# Patient Record
Sex: Female | Born: 1995 | Race: Black or African American | Hispanic: No | Marital: Single | Smoking: Never smoker
Health system: Southern US, Community
[De-identification: ages and names within clinical notes are randomized; demographics above are authoritative.]

## PROBLEM LIST (undated history)

## (undated) ENCOUNTER — Inpatient Hospital Stay (HOSPITAL_COMMUNITY): Payer: Self-pay

## (undated) HISTORY — PX: OTHER SURGICAL HISTORY: SHX169

---
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
## 2016-03-21 LAB — OB RESULTS CONSOLE ANTIBODY SCREEN: Antibody Screen: NEGATIVE

## 2016-03-21 LAB — OB RESULTS CONSOLE GC/CHLAMYDIA
CHLAMYDIA, DNA PROBE: NEGATIVE
Gonorrhea: NEGATIVE

## 2016-03-21 LAB — OB RESULTS CONSOLE HIV ANTIBODY (ROUTINE TESTING): HIV: NONREACTIVE

## 2016-03-21 LAB — OB RESULTS CONSOLE HEPATITIS B SURFACE ANTIGEN: HEP B S AG: NEGATIVE

## 2016-03-21 LAB — OB RESULTS CONSOLE ABO/RH: RH Type: POSITIVE

## 2016-03-21 LAB — OB RESULTS CONSOLE RPR: RPR: NONREACTIVE

## 2016-03-21 LAB — OB RESULTS CONSOLE RUBELLA ANTIBODY, IGM: RUBELLA: IMMUNE

## 2016-08-02 ENCOUNTER — Inpatient Hospital Stay (HOSPITAL_COMMUNITY)
Admission: AD | Admit: 2016-08-02 | Discharge: 2016-08-02 | Disposition: A | Discharge status: Home / Self Care | Payer: Federal, State, Local not specified - PPO | Source: Ambulatory Visit | Attending: Obstetrics and Gynecology | Admitting: Obstetrics and Gynecology

## 2016-08-02 ENCOUNTER — Encounter (HOSPITAL_COMMUNITY): Payer: Self-pay | Admitting: *Deleted

## 2016-08-02 DIAGNOSIS — K219 Gastro-esophageal reflux disease without esophagitis: Secondary | ICD-10-CM | POA: Insufficient documentation

## 2016-08-02 DIAGNOSIS — O99612 Diseases of the digestive system complicating pregnancy, second trimester: Secondary | ICD-10-CM | POA: Diagnosis not present

## 2016-08-02 DIAGNOSIS — Z36 Encounter for antenatal screening of mother: Secondary | ICD-10-CM | POA: Diagnosis not present

## 2016-08-02 DIAGNOSIS — R1010 Upper abdominal pain, unspecified: Secondary | ICD-10-CM | POA: Diagnosis present

## 2016-08-02 DIAGNOSIS — Z3689 Encounter for other specified antenatal screening: Secondary | ICD-10-CM

## 2016-08-02 DIAGNOSIS — Z331 Pregnant state, incidental: Secondary | ICD-10-CM

## 2016-08-02 DIAGNOSIS — Z3A27 27 weeks gestation of pregnancy: Secondary | ICD-10-CM | POA: Insufficient documentation

## 2016-08-02 DIAGNOSIS — Z3493 Encounter for supervision of normal pregnancy, unspecified, third trimester: Secondary | ICD-10-CM

## 2016-08-02 LAB — AMYLASE: Amylase: 73 U/L (ref 28–100)

## 2016-08-02 LAB — URINALYSIS, ROUTINE W REFLEX MICROSCOPIC
BILIRUBIN URINE: NEGATIVE
GLUCOSE, UA: NEGATIVE mg/dL
HGB URINE DIPSTICK: NEGATIVE
Ketones, ur: NEGATIVE mg/dL
Nitrite: NEGATIVE
PH: 7 (ref 5.0–8.0)
Protein, ur: NEGATIVE mg/dL
SPECIFIC GRAVITY, URINE: 1.02 (ref 1.005–1.030)

## 2016-08-02 LAB — CBC
HEMATOCRIT: 26.8 % — AB (ref 36.0–46.0)
HEMOGLOBIN: 9 g/dL — AB (ref 12.0–15.0)
MCH: 27 pg (ref 26.0–34.0)
MCHC: 33.6 g/dL (ref 30.0–36.0)
MCV: 80.5 fL (ref 78.0–100.0)
Platelets: 171 10*3/uL (ref 150–400)
RBC: 3.33 MIL/uL — AB (ref 3.87–5.11)
RDW: 14.4 % (ref 11.5–15.5)
WBC: 7.6 10*3/uL (ref 4.0–10.5)

## 2016-08-02 LAB — COMPREHENSIVE METABOLIC PANEL
ALBUMIN: 2.8 g/dL — AB (ref 3.5–5.0)
ALT: 15 U/L (ref 14–54)
AST: 15 U/L (ref 15–41)
Alkaline Phosphatase: 70 U/L (ref 38–126)
Anion gap: 7 (ref 5–15)
BUN: 9 mg/dL (ref 6–20)
CO2: 23 mmol/L (ref 22–32)
CREATININE: 0.49 mg/dL (ref 0.44–1.00)
Calcium: 8.6 mg/dL — ABNORMAL LOW (ref 8.9–10.3)
Chloride: 106 mmol/L (ref 101–111)
GFR calc Af Amer: >60 mL/min (ref >60)
GFR calc non Af Amer: >60 mL/min (ref >60)
Glucose, Bld: 96 mg/dL (ref 65–99)
POTASSIUM: 3.4 mmol/L — AB (ref 3.5–5.1)
Sodium: 136 mmol/L (ref 135–145)
Total Bilirubin: 0.3 mg/dL (ref 0.3–1.2)
Total Protein: 6.2 g/dL — ABNORMAL LOW (ref 6.5–8.1)

## 2016-08-02 LAB — URINE MICROSCOPIC-ADD ON: RBC / HPF: NONE SEEN RBC/hpf (ref 0–5)

## 2016-08-02 LAB — LIPASE, BLOOD: Lipase: 21 U/L (ref 11–51)

## 2016-08-02 MED ORDER — GI COCKTAIL ~~LOC~~
30.0000 mL | Freq: Once | ORAL | Status: AC
  Administered 2016-08-02: 30 mL via ORAL
  Filled 2016-08-02: qty 30

## 2016-08-02 MED ORDER — RANITIDINE HCL 150 MG PO TABS: 150.0000 mg | ORAL_TABLET | Freq: Every day | ORAL | 1 refills | Status: DC

## 2016-08-02 NOTE — MAU Provider Note (Signed)
  History     CSN: [id number]  Arrival date and time: 08/02/16 1731   None     Chief Complaint  Patient presents with  . Abdominal Pain   G2P0010 @27 .6 weeks c/o intermittent upper abdominal pain since this am. She describes it feeling like a "charley horse" but sharp at times. She thinks it may be worse after meals. She reports having bacon with her breakfast. She denies N/V/C/D. No fevers. She reports good FM. No VB, LOF, or ctx. She reports hx of heartburn at times. She reports the pain is decreased from earlier today.    OB History    Gravida Para Term Preterm AB Living   2       1     SAB TAB Ectopic Multiple Live Births   1              History reviewed. No pertinent past medical history.  Past Surgical History:  Procedure Laterality Date  . dilate and evacutation      History reviewed. No pertinent family history.  Social History  Substance Use Topics  . Smoking status: Never Smoker  . Smokeless tobacco: Not on file  . Alcohol use No    Allergies: No Known Allergies  Prescriptions Prior to Admission  Medication Sig Dispense Refill Last Dose  . ibuprofen (ADVIL,MOTRIN) 800 MG tablet Take 1 tablet (800 mg total) by mouth every 8 (eight) hours as needed. 15 tablet 0   . naproxen sodium (ANAPROX) 220 MG tablet Take 220 mg by mouth 2 (two) times daily with a meal.   11/02/2013 at Unknown time    Review of Systems  Constitutional: Negative.   Gastrointestinal: Positive for abdominal pain.  Genitourinary: Negative.    Physical Exam   Blood pressure 114/61, pulse 88, temperature 98 F (36.7 C), temperature source Oral, resp. rate 16.  Physical Exam  Constitutional: She is oriented to person, place, and time. She appears well-developed and well-nourished.  HENT:  Head: Normocephalic and atraumatic.  Neck: Normal range of motion. Neck supple.  Cardiovascular: Normal rate.   Respiratory: Effort normal.  GI: Soft. She exhibits no distension and no mass.  There is no tenderness. There is no rebound and no guarding.  gravid  Genitourinary:  Genitourinary Comments: SVE: closed/thick  Musculoskeletal: Normal range of motion.  Neurological: She is alert and oriented to person, place, and time.  Skin: Skin is warm and dry.  Psychiatric: She has a normal mood and affect.   EFM: 150 bpm, mod variability, + accels, no decels Toco: none  MAU Course  Procedures GI cocktail  MDM Labs ordered and reviewed. Presentation, clinical findings, and plan discussed with Dr. Rana SnareLowe. Pain improved after GI cocktail. No evidence of acute GI process or PTL. Stable for discharge home.  Assessment and Plan   1. Gastroesophageal reflux disease without esophagitis   2. Third trimester pregnancy   3. NST (non-stress test) reactive    Discharge home GERD diet Follow up in office as scheduled    Medication List    TAKE these medications   ferrous sulfate 325 (65 FE) MG tablet Take 325 mg by mouth daily with breakfast.   prenatal multivitamin Tabs tablet Take 1 tablet by mouth daily at 12 noon.   ranitidine 150 MG tablet Commonly known as:  ZANTAC Take 1 tablet (150 mg total) by mouth at bedtime.       Donette LarryMelanie Detron Carras, CNM 08/02/2016, 6:50 PM

## 2016-08-02 NOTE — Discharge Instructions (Signed)
Food Choices for Gastroesophageal Reflux Disease, Adult °When you have gastroesophageal reflux disease (GERD), the foods you eat and your eating habits are very important. Choosing the right foods can help ease the discomfort of GERD. °WHAT GENERAL GUIDELINES DO I NEED TO FOLLOW? °· Choose fruits, vegetables, whole grains, low-fat dairy products, and low-fat meat, fish, and poultry. °· Limit fats such as oils, salad dressings, butter, nuts, and avocado. °· Keep a food diary to identify foods that cause symptoms. °· Avoid foods that cause reflux. These may be different for different people. °· Eat frequent small meals instead of three large meals each day. °· Eat your meals slowly, in a relaxed setting. °· Limit fried foods. °· Cook foods using methods other than frying. °· Avoid drinking alcohol. °· Avoid drinking large amounts of liquids with your meals. °· Avoid bending over or lying down until 2-3 hours after eating. °WHAT FOODS ARE NOT RECOMMENDED? °The following are some foods and drinks that may worsen your symptoms: °Vegetables °Tomatoes. Tomato juice. Tomato and spaghetti sauce. Chili peppers. Onion and garlic. Horseradish. °Fruits °Oranges, grapefruit, and lemon (fruit and juice). °Meats °High-fat meats, fish, and poultry. This includes hot dogs, ribs, ham, sausage, salami, and bacon. °Dairy °Whole milk and chocolate milk. Sour cream. Cream. Butter. Ice cream. Cream cheese.  °Beverages °Coffee and tea, with or without caffeine. Carbonated beverages or energy drinks. °Condiments °Hot sauce. Barbecue sauce.  °Sweets/Desserts °Chocolate and cocoa. Donuts. Peppermint and spearmint. °Fats and Oils °High-fat foods, including French fries and potato chips. °Other °Vinegar. Strong spices, such as black pepper, white pepper, red pepper, cayenne, curry powder, cloves, ginger, and chili powder. °The items listed above may not be a complete list of foods and beverages to avoid. Contact your dietitian for more  information. °  °This information is not intended to replace advice given to you by your health care provider. Make sure you discuss any questions you have with your health care provider. °  °Document Released: 10/22/2005 Document Revised: 11/12/2014 Document Reviewed: 08/26/2013 °Elsevier Interactive Patient Education ©2016 [employer] ° °Gastroesophageal Reflux Disease, Adult °Normally, food travels down the esophagus and stays in the stomach to be digested. However, when a person has gastroesophageal reflux disease (GERD), food and stomach acid move back up into the esophagus. When this happens, the esophagus becomes sore and inflamed. Over time, GERD can create small holes (ulcers) in the lining of the esophagus.  °CAUSES °This condition is caused by a problem with the muscle between the esophagus and the stomach (lower esophageal sphincter, or LES). Normally, the LES muscle closes after food passes through the esophagus to the stomach. When the LES is weakened or abnormal, it does not close properly, and that allows food and stomach acid to go back up into the esophagus. The LES can be weakened by certain dietary substances, medicines, and medical conditions, including: °· Tobacco use. °· Pregnancy. °· Having a hiatal hernia. °· Heavy alcohol use. °· Certain foods and beverages, such as coffee, chocolate, onions, and peppermint. °RISK FACTORS °This condition is more likely to develop in: °· People who have an increased body weight. °· People who have connective tissue disorders. °· People who use NSAID medicines. °SYMPTOMS °Symptoms of this condition include: °· Heartburn. °· Difficult or painful swallowing. °· The feeling of having a lump in the throat. °· A bitter taste in the mouth. °· Bad breath. °· Having a large amount of saliva. °· Having an upset or bloated stomach. °· Belching. °· Chest pain. °·   Shortness of breath or wheezing. °· Ongoing (chronic) cough or a night-time cough. °· Wearing away of  tooth enamel. °· Weight loss. °Different conditions can cause chest pain. Make sure to see your health care provider if you experience chest pain. °DIAGNOSIS °Your health care provider will take a medical history and perform a physical exam. To determine if you have mild or severe GERD, your health care provider may also monitor how you respond to treatment. You may also have other tests, including: °· An endoscopy to examine your stomach and esophagus with a small camera. °· A test that measures the acidity level in your esophagus. °· A test that measures how much pressure is on your esophagus. °· A barium swallow or modified barium swallow to show the shape, size, and functioning of your esophagus. °TREATMENT °The goal of treatment is to help relieve your symptoms and to prevent complications. Treatment for this condition may vary depending on how severe your symptoms are. Your health care provider may recommend: °· Changes to your diet. °· Medicine. °· Surgery. °HOME CARE INSTRUCTIONS °Diet °· Follow a diet as recommended by your health care provider. This may involve avoiding foods and drinks such as: °¨ Coffee and tea (with or without caffeine). °¨ Drinks that contain alcohol. °¨ Energy drinks and sports drinks. °¨ Carbonated drinks or sodas. °¨ Chocolate and cocoa. °¨ Peppermint and mint flavorings. °¨ Garlic and onions. °¨ Horseradish. °¨ Spicy and acidic foods, including peppers, chili powder, curry powder, vinegar, hot sauces, and barbecue sauce. °¨ Citrus fruit juices and citrus fruits, such as oranges, lemons, and limes. °¨ Tomato-based foods, such as red sauce, chili, salsa, and pizza with red sauce. °¨ Fried and fatty foods, such as donuts, french fries, potato chips, and high-fat dressings. °¨ High-fat meats, such as hot dogs and fatty cuts of red and white meats, such as rib eye steak, sausage, ham, and bacon. °¨ High-fat dairy items, such as whole milk, butter, and cream cheese. °· Eat small,  frequent meals instead of large meals. °· Avoid drinking large amounts of liquid with your meals. °· Avoid eating meals during the 2-3 hours before bedtime. °· Avoid lying down right after you eat. °· Do not exercise right after you eat. ° General Instructions  °· Pay attention to any changes in your symptoms. °· Take over-the-counter and prescription medicines only as told by your health care provider. Do not take aspirin, ibuprofen, or other NSAIDs unless your health care provider told you to do so. °· Do not use any tobacco products, including cigarettes, chewing tobacco, and e-cigarettes. If you need help quitting, ask your health care provider. °· Wear loose-fitting clothing. Do not wear anything tight around your waist that causes pressure on your abdomen. °· Raise (elevate) the head of your bed 6 inches (15cm). °· Try to reduce your stress, such as with yoga or meditation. If you need help reducing stress, ask your health care provider. °· If you are overweight, reduce your weight to an amount that is healthy for you. Ask your health care provider for guidance about a safe weight loss goal. °· Keep all follow-up visits as told by your health care provider. This is important. °SEEK MEDICAL CARE IF: °· You have new symptoms. °· You have unexplained weight loss. °· You have difficulty swallowing, or it hurts to swallow. °· You have wheezing or a persistent cough. °· Your symptoms do not improve with treatment. °· You have a hoarse voice. °SEEK IMMEDIATE MEDICAL CARE IF: °· You have pain   in your arms, neck, jaw, teeth, or back. °· You feel sweaty, dizzy, or light-headed. °· You have chest pain or shortness of breath. °· You vomit and your vomit looks like blood or coffee grounds. °· You faint. °· Your stool is bloody or black. °· You cannot swallow, drink, or eat. °  °This information is not intended to replace advice given to you by your health care provider. Make sure you discuss any questions you have with  your health care provider. °  °Document Released: 08/01/2005 Document Revised: 07/13/2015 Document Reviewed: 02/16/2015 °Elsevier Interactive Patient Education ©2016 Elsevier Inc. ° °

## 2016-08-02 NOTE — MAU Note (Signed)
Pt C/O sharp pain across mid abdomen that started today, is worse with standing or walking.  Denies bleeding or LOF.  Denies dysuria, vomiting or diarrhea.

## 2016-10-01 LAB — OB RESULTS CONSOLE GBS: GBS: POSITIVE

## 2016-10-08 ENCOUNTER — Other Ambulatory Visit (HOSPITAL_COMMUNITY): Payer: Self-pay | Admitting: Obstetrics and Gynecology

## 2016-10-08 DIAGNOSIS — R52 Pain, unspecified: Secondary | ICD-10-CM

## 2016-10-09 ENCOUNTER — Ambulatory Visit (HOSPITAL_COMMUNITY)
Admission: RE | Admit: 2016-10-09 | Discharge: 2016-10-09 | Disposition: A | Discharge status: Home / Self Care | Payer: Federal, State, Local not specified - PPO | Source: Ambulatory Visit | Attending: Obstetrics and Gynecology | Admitting: Obstetrics and Gynecology

## 2016-10-09 DIAGNOSIS — M7989 Other specified soft tissue disorders: Secondary | ICD-10-CM | POA: Insufficient documentation

## 2016-10-09 DIAGNOSIS — R52 Pain, unspecified: Secondary | ICD-10-CM | POA: Insufficient documentation

## 2016-10-09 NOTE — Progress Notes (Signed)
VASCULAR LAB PRELIMINARY  PRELIMINARY  PRELIMINARY  PRELIMINARY  Right lower extremity venous duplex completed.    Preliminary report:  Right:  No evidence of DVT, superficial thrombosis, or Baker's cyst.  Adyn Hoes, RVS 10/09/2016, 3:46 PM

## 2016-10-18 ENCOUNTER — Inpatient Hospital Stay (HOSPITAL_COMMUNITY): Payer: 59 | Admitting: Anesthesiology

## 2016-10-18 ENCOUNTER — Inpatient Hospital Stay (HOSPITAL_COMMUNITY)
Admission: AD | Admit: 2016-10-18 | Discharge: 2016-10-20 | DRG: 775 | Disposition: A | Discharge status: Home / Self Care | Payer: 59 | Source: Ambulatory Visit | Attending: Obstetrics and Gynecology | Admitting: Obstetrics and Gynecology

## 2016-10-18 ENCOUNTER — Encounter (HOSPITAL_COMMUNITY): Payer: Self-pay

## 2016-10-18 DIAGNOSIS — O4292 Full-term premature rupture of membranes, unspecified as to length of time between rupture and onset of labor: Secondary | ICD-10-CM | POA: Diagnosis present

## 2016-10-18 DIAGNOSIS — Z3A38 38 weeks gestation of pregnancy: Secondary | ICD-10-CM

## 2016-10-18 LAB — CBC
HCT: 33 % — ABNORMAL LOW (ref 36.0–46.0)
Hemoglobin: 10.6 g/dL — ABNORMAL LOW (ref 12.0–15.0)
MCH: 26.6 pg (ref 26.0–34.0)
MCHC: 32.1 g/dL (ref 30.0–36.0)
MCV: 82.9 fL (ref 78.0–100.0)
PLATELETS: 126 10*3/uL — AB (ref 150–400)
RBC: 3.98 MIL/uL (ref 3.87–5.11)
RDW: 15.6 % — ABNORMAL HIGH (ref 11.5–15.5)
WBC: 7.9 10*3/uL (ref 4.0–10.5)

## 2016-10-18 LAB — TYPE AND SCREEN
ABO/RH(D): O POS
Antibody Screen: NEGATIVE

## 2016-10-18 LAB — RPR: RPR Ser Ql: NONREACTIVE

## 2016-10-18 LAB — POCT FERN TEST: POCT FERN TEST: POSITIVE

## 2016-10-18 LAB — ABO/RH: ABO/RH(D): O POS

## 2016-10-18 MED ORDER — BENZOCAINE-MENTHOL 20-0.5 % EX AERO
1.0000 "application " | INHALATION_SPRAY | CUTANEOUS | Status: DC | PRN
  Filled 2016-10-18 (×2): qty 56

## 2016-10-18 MED ORDER — ACETAMINOPHEN 325 MG PO TABS: 650.0000 mg | ORAL_TABLET | ORAL | Status: DC | PRN

## 2016-10-18 MED ORDER — DEXTROSE 5 % IV SOLN
5.0000 10*6.[IU] | Freq: Once | INTRAVENOUS | Status: AC
  Administered 2016-10-18: 5 10*6.[IU] via INTRAVENOUS
  Filled 2016-10-18: qty 5

## 2016-10-18 MED ORDER — DIPHENHYDRAMINE HCL 25 MG PO CAPS: 25.0000 mg | ORAL_CAPSULE | Freq: Four times a day (QID) | ORAL | Status: DC | PRN

## 2016-10-18 MED ORDER — DIPHENHYDRAMINE HCL 50 MG/ML IJ SOLN: 12.5000 mg | INTRAMUSCULAR | Status: DC | PRN

## 2016-10-18 MED ORDER — FENTANYL 2.5 MCG/ML BUPIVACAINE 1/10 % EPIDURAL INFUSION (WH - ANES): 14.0000 mL/h | INTRAMUSCULAR | Status: DC | PRN

## 2016-10-18 MED ORDER — LIDOCAINE HCL (PF) 1 % IJ SOLN
INTRAMUSCULAR | Status: DC | PRN
  Administered 2016-10-18 (×2): 4 mL

## 2016-10-18 MED ORDER — SIMETHICONE 80 MG PO CHEW: 80.0000 mg | CHEWABLE_TABLET | ORAL | Status: DC | PRN

## 2016-10-18 MED ORDER — IBUPROFEN 600 MG PO TABS
600.0000 mg | ORAL_TABLET | Freq: Four times a day (QID) | ORAL | Status: DC
  Administered 2016-10-18 – 2016-10-20 (×7): 600 mg via ORAL
  Filled 2016-10-18 (×7): qty 1

## 2016-10-18 MED ORDER — WITCH HAZEL-GLYCERIN EX PADS: 1.0000 "application " | MEDICATED_PAD | CUTANEOUS | Status: DC | PRN

## 2016-10-18 MED ORDER — COCONUT OIL OIL: 1.0000 "application " | TOPICAL_OIL | Status: DC | PRN

## 2016-10-18 MED ORDER — LACTATED RINGERS IV SOLN
INTRAVENOUS | Status: DC
  Administered 2016-10-18: 02:00:00 via INTRAVENOUS

## 2016-10-18 MED ORDER — OXYTOCIN BOLUS FROM INFUSION: 500.0000 mL | Freq: Once | INTRAVENOUS | Status: DC

## 2016-10-18 MED ORDER — PHENYLEPHRINE 40 MCG/ML (10ML) SYRINGE FOR IV PUSH (FOR BLOOD PRESSURE SUPPORT)
80.0000 ug | PREFILLED_SYRINGE | INTRAVENOUS | Status: DC | PRN
  Filled 2016-10-18: qty 5

## 2016-10-18 MED ORDER — SOD CITRATE-CITRIC ACID 500-334 MG/5ML PO SOLN: 30.0000 mL | ORAL | Status: DC | PRN

## 2016-10-18 MED ORDER — OXYCODONE-ACETAMINOPHEN 5-325 MG PO TABS: 2.0000 | ORAL_TABLET | ORAL | Status: DC | PRN

## 2016-10-18 MED ORDER — LIDOCAINE HCL (PF) 1 % IJ SOLN
30.0000 mL | INTRAMUSCULAR | Status: DC | PRN
  Filled 2016-10-18: qty 30

## 2016-10-18 MED ORDER — OXYTOCIN 40 UNITS IN LACTATED RINGERS INFUSION - SIMPLE MED
2.5000 [IU]/h | INTRAVENOUS | Status: DC
  Filled 2016-10-18: qty 1000

## 2016-10-18 MED ORDER — ONDANSETRON HCL 4 MG/2ML IJ SOLN: 4.0000 mg | INTRAMUSCULAR | Status: DC | PRN

## 2016-10-18 MED ORDER — FENTANYL 2.5 MCG/ML BUPIVACAINE 1/10 % EPIDURAL INFUSION (WH - ANES)
14.0000 mL/h | INTRAMUSCULAR | Status: DC | PRN
  Administered 2016-10-18: 14 mL/h via EPIDURAL
  Filled 2016-10-18: qty 100

## 2016-10-18 MED ORDER — ZOLPIDEM TARTRATE 5 MG PO TABS: 5.0000 mg | ORAL_TABLET | Freq: Every evening | ORAL | Status: DC | PRN

## 2016-10-18 MED ORDER — SENNOSIDES-DOCUSATE SODIUM 8.6-50 MG PO TABS
2.0000 | ORAL_TABLET | ORAL | Status: DC
  Administered 2016-10-19: 2 via ORAL
  Filled 2016-10-18 (×2): qty 2

## 2016-10-18 MED ORDER — ONDANSETRON HCL 4 MG PO TABS: 4.0000 mg | ORAL_TABLET | ORAL | Status: DC | PRN

## 2016-10-18 MED ORDER — ONDANSETRON HCL 4 MG/2ML IJ SOLN: 4.0000 mg | Freq: Four times a day (QID) | INTRAMUSCULAR | Status: DC | PRN

## 2016-10-18 MED ORDER — OXYCODONE HCL 5 MG PO TABS: 10.0000 mg | ORAL_TABLET | ORAL | Status: DC | PRN

## 2016-10-18 MED ORDER — OXYCODONE HCL 5 MG PO TABS
5.0000 mg | ORAL_TABLET | ORAL | Status: DC | PRN
Start: 2016-10-18 — End: 2016-10-20

## 2016-10-18 MED ORDER — FLEET ENEMA 7-19 GM/118ML RE ENEM: 1.0000 | ENEMA | RECTAL | Status: DC | PRN

## 2016-10-18 MED ORDER — TETANUS-DIPHTH-ACELL PERTUSSIS 5-2.5-18.5 LF-MCG/0.5 IM SUSP: 0.5000 mL | Freq: Once | INTRAMUSCULAR | Status: DC

## 2016-10-18 MED ORDER — LACTATED RINGERS IV SOLN
500.0000 mL | Freq: Once | INTRAVENOUS | Status: AC
  Administered 2016-10-18: 500 mL via INTRAVENOUS

## 2016-10-18 MED ORDER — LACTATED RINGERS IV SOLN: 500.0000 mL | INTRAVENOUS | Status: DC | PRN

## 2016-10-18 MED ORDER — PENICILLIN G POT IN DEXTROSE 60000 UNIT/ML IV SOLN
3.0000 10*6.[IU] | INTRAVENOUS | Status: DC
  Administered 2016-10-18 (×2): 3 10*6.[IU] via INTRAVENOUS
  Filled 2016-10-18 (×6): qty 50

## 2016-10-18 MED ORDER — EPHEDRINE 5 MG/ML INJ
10.0000 mg | INTRAVENOUS | Status: DC | PRN
  Filled 2016-10-18: qty 4

## 2016-10-18 MED ORDER — OXYCODONE-ACETAMINOPHEN 5-325 MG PO TABS
1.0000 | ORAL_TABLET | ORAL | Status: DC | PRN
Start: 2016-10-18 — End: 2016-10-18

## 2016-10-18 MED ORDER — PHENYLEPHRINE 40 MCG/ML (10ML) SYRINGE FOR IV PUSH (FOR BLOOD PRESSURE SUPPORT)
80.0000 ug | PREFILLED_SYRINGE | INTRAVENOUS | Status: DC | PRN
  Filled 2016-10-18: qty 10
  Filled 2016-10-18: qty 5

## 2016-10-18 MED ORDER — DIBUCAINE 1 % RE OINT: 1.0000 "application " | TOPICAL_OINTMENT | RECTAL | Status: DC | PRN

## 2016-10-18 MED ORDER — PRENATAL MULTIVITAMIN CH
1.0000 | ORAL_TABLET | Freq: Every day | ORAL | Status: DC
  Administered 2016-10-19: 1 via ORAL
  Filled 2016-10-18: qty 1

## 2016-10-18 NOTE — Anesthesia Postprocedure Evaluation (Signed)
Anesthesia Post Note  Patient: Michelle Goodman  Procedure(s) Performed: * No procedures listed *  Patient location during evaluation: Mother Baby Anesthesia Type: Epidural Level of consciousness: awake and alert Pain management: satisfactory to patient Vital Signs Assessment: post-procedure vital signs reviewed and stable Respiratory status: respiratory function stable Cardiovascular status: stable Postop Assessment: no headache, no backache, epidural receding, patient able to bend at knees, no signs of nausea or vomiting and adequate PO intake Anesthetic complications: no     Last Vitals:  Vitals:   10/18/16 1511 10/18/16 1625  BP: 100/62 111/61  Pulse: 73 78  Resp: 16 17  Temp: 36.4 C 36.8 C    Last Pain:  Vitals:   10/18/16 1701  TempSrc:   PainSc: 8    Pain Goal:                 Michelle Goodman

## 2016-10-18 NOTE — MAU Note (Signed)
Patient presents with c/o ctxs 10 mins apart. Patient states that her her water broke around 30 mins ago. Fetus active. GBS +

## 2016-10-18 NOTE — MAU Note (Signed)
Pt reports ROM and contractions since 0030

## 2016-10-18 NOTE — Anesthesia Preprocedure Evaluation (Signed)
Anesthesia Evaluation  Patient identified by MRN, date of birth, ID band Patient awake    Reviewed: Allergy & Precautions, NPO status , Patient's Chart, lab work & pertinent test results  Airway Mallampati: II  TM Distance: >3 FB Neck ROM: Full    Dental no notable dental hx.    Pulmonary neg pulmonary ROS,    Pulmonary exam normal breath sounds clear to auscultation       Cardiovascular negative cardio ROS Normal cardiovascular exam Rhythm:Regular Rate:Normal     Neuro/Psych negative neurological ROS  negative psych ROS   GI/Hepatic negative GI ROS, Neg liver ROS,   Endo/Other  negative endocrine ROS  Renal/GU negative Renal ROS     Musculoskeletal negative musculoskeletal ROS (+)   Abdominal   Peds  Hematology negative hematology ROS (+)   Anesthesia Other Findings   Reproductive/Obstetrics (+) Pregnancy                             Anesthesia Physical Anesthesia Plan  ASA: II  Anesthesia Plan: Epidural   Post-op Pain Management:    Induction:   Airway Management Planned:   Additional Equipment:   Intra-op Plan:   Post-operative Plan:   Informed Consent: I have reviewed the patients History and Physical, chart, labs and discussed the procedure including the risks, benefits and alternatives for the proposed anesthesia with the patient or authorized representative who has indicated his/her understanding and acceptance.     Plan Discussed with:   Anesthesia Plan Comments:         Anesthesia Quick Evaluation  

## 2016-10-18 NOTE — Progress Notes (Signed)
Delivery Note At 12:48 PM a viable female was delivered via  (Presentation:LOA ;  ).  APGAR: , ; weight  .   Placenta status:intact , .  Cord: 3 vessels with the following complications: .  Cord pH:7.20  Anesthesia:   Episiotomy:  none Lacerations: second degree midline with left labial sulcus second degree extension-both repaired  Suture Repair: 2.0 vicryl rapide Est. Blood Loss (mL):  350  Mom to postpartum.  Baby to Couplet care / Skin to Skin.  Michelle Goodman,Ildefonso Keaney E 10/18/2016, 1:07 PM

## 2016-10-18 NOTE — Progress Notes (Signed)
Cx 9/C/-1/vtx AROM of forebag-clear FHT cat one

## 2016-10-18 NOTE — Anesthesia Procedure Notes (Signed)
Epidural Patient location during procedure: OB  Staffing Anesthesiologist: Rosemae Mcquown Performed: anesthesiologist   Preanesthetic Checklist Completed: patient identified, pre-op evaluation, timeout performed, IV checked, risks and benefits discussed and monitors and equipment checked  Epidural Patient position: sitting Prep: site prepped and draped and DuraPrep Patient monitoring: heart rate Approach: midline Location: L3-L4 Injection technique: LOR air and LOR saline  Needle:  Needle type: Tuohy  Needle gauge: 17 G Needle length: 9 cm Needle insertion depth: 5 cm Catheter type: closed end flexible Catheter size: 19 Gauge Catheter at skin depth: 11 cm Test dose: negative  Assessment Sensory level: T8 Events: blood not aspirated, injection not painful, no injection resistance, negative IV test and no paresthesia  Additional Notes Reason for block:procedure for pain     

## 2016-10-18 NOTE — H&P (Signed)
Michelle Goodman is a 20 y.o. female G3P0 @ 38+6 wks presenting for SOL/SROM.  Fluid clear.  Pregnancy uncomplicated.  Pt reports regular ctx Q8-10 min   OB History    Gravida Para Term Preterm AB Living   2       1     SAB TAB Ectopic Multiple Live Births   1             History reviewed. No pertinent past medical history. Past Surgical History:  Procedure Laterality Date  . dilate and evacutation     Family History: family history is not on file. Social History:  reports that she has never smoked. She does not have any smokeless tobacco history on file. She reports that she does not drink alcohol or use drugs.     Maternal Diabetes: No Genetic Screening: Declined Maternal Ultrasounds/Referrals: Normal Fetal Ultrasounds or other Referrals:  None Maternal Substance Abuse:  No Significant Maternal Medications:  None Significant Maternal Lab Results:  None Other Comments:  None  ROS History Dilation: 3 Effacement (%): 90 Station: -2 Exam by:: Kayren EavesAshley Garvey RN  Blood pressure 127/75, pulse 105, resp. rate 18. Exam Physical Exam  Gen - NAD Abd - gravid, NT Cvx 3cm Prenatal labs: ABO, Rh: O/Positive/-- (05/17 0000) Antibody: Negative (05/17 0000) Rubella: Immune (05/17 0000) RPR: Nonreactive (05/17 0000)  HBsAg: Negative (05/17 0000)  HIV: Non-reactive (05/17 0000)  GBS: Positive (11/27 0000)   Assessment/Plan:  38+6 wks with labor Admit PCN Epidural prn Exp mngt   Michelle Goodman 10/18/2016, 2:13 AM

## 2016-10-18 NOTE — Anesthesia Pain Management Evaluation Note (Signed)
  [facility]NA Pain Management Visit Note  Patient: Michelle Goodman, 20 y.o., female  "Hello I am a member of the anesthesia team at [facility]. We have an anesthesia team available at all times to provide care throughout the hospital, including epidural management and anesthesia for C-section. I don't know your plan for the delivery whether it a natural birth, water birth, IV sedation, nitrous supplementation, doula or epidural, but we want to meet your pain goals."   1.Was your pain managed to your expectations on prior hospitalizations?   No prior hospitalizations  2.What is your expectation for pain management during this hospitalization?     Epidural  3.How can we help you reach that goal? Epidural in situ  Record the patient's initial score and the patient's pain goal.   Pain: 0  Pain Goal: 8 [facility] wants you to be able to say your pain was always managed very well.  Tanner Vigna L 10/18/2016

## 2016-10-18 NOTE — Lactation Note (Signed)
This note was copied from a baby's chart. Lactation Consultation Note  Patient Name: Michelle Goodman [id number]'s Date: 10/18/2016 Reason for consult: Initial assessment;Difficult latch   Initial assessment with first time mom [facility]. Infant STS and cueing to feed. Assisted mom in latching infant to breast. We attempted both breasts in various positions. Infant was unable to sustain latch for more than a few sucks. Infant is noted to have a high palate and is noted to be tongue thrusting and tongue sucking at the breast. She did not cup finger well with suckle on gloved finger. Mom with soft compressible breasts and flat/inverted nipples. Discussed with mom using breast shells and a hand pump prior to latch. Also enc family to perform suck training prior to latch . Mom with very long fingernails and will not be able to perform suck training with feeding.   Worked with mom on positioning and she wanted to use the cradle hold vs the cross cradle hold. Mom pinching the breast near the nipple with feeding, enc her to use positioning to give infant breathing space. Mom kept mentioning giving infant formula, enc mom to give infant time to practice BF before giving up. Enc mom to call out to desk for feeding assistance as needed. Phone Report was given to Aurther Lofterry, RN, asked her to give mom hand pump and breast shells.   Mom reports she has an Ahmeda pump at home. LC Brochure and BF Resources Handout given, mom informed of IP/OP Services, BF Support Groups and LC phone #. Enc mom to call out to desk for feeding assistance as needed.    Maternal Data Formula Feeding for Exclusion: No Has patient been taught Hand Expression?: Yes Does the patient have breastfeeding experience prior to this delivery?: No  Feeding Feeding Type: Breast Fed  LATCH Score/Interventions Latch: Repeated attempts needed to sustain latch, nipple held in mouth throughout feeding, stimulation needed to elicit sucking  reflex.  Audible Swallowing: None  Type of Nipple: Flat Intervention(s): Hand pump;Shells  Comfort (Breast/Nipple): Soft / non-tender     Hold (Positioning): Assistance needed to correctly position infant at breast and maintain latch. Intervention(s): Breastfeeding basics reviewed;Support Pillows;Position options;Skin to skin  LATCH Score: 5  Lactation Tools Discussed/Used WIC Program: No   Consult Status Consult Status: Follow-up Date: 10/19/16 Follow-up type: In-patient    Michelle Goodman 10/18/2016, 2:25 PM

## 2016-10-19 LAB — CBC
HEMATOCRIT: 27 % — AB (ref 36.0–46.0)
Hemoglobin: 8.9 g/dL — ABNORMAL LOW (ref 12.0–15.0)
MCH: 27.2 pg (ref 26.0–34.0)
MCHC: 33 g/dL (ref 30.0–36.0)
MCV: 82.6 fL (ref 78.0–100.0)
PLATELETS: 113 10*3/uL — AB (ref 150–400)
RBC: 3.27 MIL/uL — AB (ref 3.87–5.11)
RDW: 15.8 % — ABNORMAL HIGH (ref 11.5–15.5)
WBC: 11 10*3/uL — ABNORMAL HIGH (ref 4.0–10.5)

## 2016-10-19 NOTE — Progress Notes (Signed)
Post Partum Day 1 Subjective: no complaints, up ad lib, voiding, tolerating PO and + flatus  Objective: Blood pressure 106/62, pulse 81, temperature 98.5 F (36.9 C), resp. rate 16, height 5\' 3"  (1.6 m), weight 170 lb (77.1 kg), SpO2 100 %, unknown if currently breastfeeding.  Physical Exam:  General: alert and cooperative Lochia: appropriate Uterine Fundus: firm Incision: healing well DVT Evaluation: No evidence of DVT seen on physical exam. Negative Homan's sign. No cords or calf tenderness. No significant calf/ankle edema.   Recent Labs  10/18/16 0150  HGB 10.6*  HCT 33.0*    Assessment/Plan: Plan for discharge tomorrow, Breastfeeding and Lactation consult   LOS: 1 day   CURTIS,CAROL G 10/19/2016, 6:28 AM

## 2016-10-19 NOTE — Plan of Care (Signed)
Problem: Nutritional: Goal: Mothers verbalization of comfort with breastfeeding process will improve Outcome: Not Progressing Mother continues to offer infant formula per bottle and has not requested assistance to pump breasts or breastfeed infant.

## 2016-10-20 MED ORDER — IBUPROFEN 600 MG PO TABS: 600.0000 mg | ORAL_TABLET | Freq: Four times a day (QID) | ORAL | 0 refills | Status: DC

## 2016-10-20 NOTE — Discharge Summary (Signed)
Obstetric Discharge Summary Reason for Admission: onset of labor Prenatal Procedures: none Intrapartum Procedures: spontaneous vaginal delivery Postpartum Procedures: none Complications-Operative and Postpartum: none Hemoglobin  Date Value Ref Range Status  10/19/2016 8.9 (L) 12.0 - 15.0 g/dL Final   HCT  Date Value Ref Range Status  10/19/2016 27.0 (L) 36.0 - 46.0 % Final    Physical Exam:  General: alert, cooperative, appears stated age and no distress Lochia: appropriate Uterine Fundus: firm Incision: healing well DVT Evaluation: No evidence of DVT seen on physical exam.  Discharge Diagnoses: Term Pregnancy-delivered  Discharge Information: Date: 10/20/2016 Activity: pelvic rest Diet: routine Medications: Ibuprofen Condition: stable Instructions: refer to practice specific booklet Discharge to: home   Newborn Data: Live born female  Birth Weight: 7 lb 5.1 oz (3320 g) APGAR: 8, 9  Home with mother.  Dailin Sosnowski C 10/20/2016, 7:11 AM

## 2016-12-27 ENCOUNTER — Encounter (HOSPITAL_COMMUNITY): Payer: Self-pay

## 2016-12-27 ENCOUNTER — Emergency Department (HOSPITAL_COMMUNITY)
Admission: EM | Admit: 2016-12-27 | Discharge: 2016-12-28 | Disposition: A | Discharge status: Home / Self Care | Payer: 59 | Attending: Emergency Medicine | Admitting: Emergency Medicine

## 2016-12-27 DIAGNOSIS — H15102 Unspecified episcleritis, left eye: Secondary | ICD-10-CM

## 2016-12-27 DIAGNOSIS — H5712 Ocular pain, left eye: Secondary | ICD-10-CM | POA: Diagnosis present

## 2016-12-27 DIAGNOSIS — H11152 Pinguecula, left eye: Secondary | ICD-10-CM | POA: Diagnosis not present

## 2016-12-27 DIAGNOSIS — Z79899 Other long term (current) drug therapy: Secondary | ICD-10-CM | POA: Diagnosis not present

## 2016-12-27 MED ORDER — TETRACAINE HCL 0.5 % OP SOLN
2.0000 [drp] | Freq: Once | OPHTHALMIC | Status: AC
  Administered 2016-12-27: 2 [drp] via OPHTHALMIC
  Filled 2016-12-27: qty 2

## 2016-12-27 MED ORDER — FLUORESCEIN SODIUM 0.6 MG OP STRP
1.0000 | ORAL_STRIP | Freq: Once | OPHTHALMIC | Status: AC
  Administered 2016-12-27: 1 via OPHTHALMIC
  Filled 2016-12-27: qty 1

## 2016-12-27 MED ORDER — DEXAMETHASONE 0.1 % OP SUSP
1.0000 [drp] | Freq: Four times a day (QID) | OPHTHALMIC | Status: DC
  Administered 2016-12-28: 1 [drp] via OPHTHALMIC
  Filled 2016-12-27: qty 5

## 2016-12-27 NOTE — ED Provider Notes (Signed)
[facility]-EMERGENCY DEPT Provider Note   CSN: [id number] Arrival date & time: 12/27/16  2211    By signing my name below, I, Michelle Goodman, attest that this documentation has been prepared under the direction and in the presence of Michelle Morn, NP. Electronically Signed: Valentino Goodman, ED Scribe. 12/27/16. 10:51 PM.  History   Chief Complaint Chief Complaint  Patient presents with  . Eye Pain   The history is provided by the patient. No language interpreter was used.  Eye Pain  This is a new problem. The current episode started 3 to 5 hours ago. The problem occurs constantly. The problem has not changed since onset.Pertinent negatives include no headaches. She has tried nothing for the symptoms.   HPI Comments: Michelle Goodman is a 21 y.o. female who presents to the Emergency Department complaining of acute onset, left eye pain onset ~6pm this evening. She denies recent trauma or injury to the affected area. She reports associated blurry vision and redness. Pt states she was driving home from work, when she suddenly began having pain to her left eye similar to a "migraine feeling". She describes having a "bump" in the inner corner of her left eye. Pt notes she removed her prescription contact lenses from her left eye about three days ago but notes keeping in the right one. No alleviating factors noted. She denies headaches.   No past medical history on file.  Patient Active Problem List   Diagnosis Date Noted  . Indication for care in labor or delivery 10/18/2016    Past Surgical History:  Procedure Laterality Date  . dilate and evacutation      OB History    Gravida Para Term Preterm AB Living   2 1 1   1 1    SAB TAB Ectopic Multiple Live Births   1     0 1       Home Medications    Prior to Admission medications   Medication Sig Start Date End Date Taking? Authorizing Provider  ferrous sulfate 325 (65 FE) MG tablet Take 325 mg by mouth daily with breakfast.     Historical Provider, MD  ibuprofen (ADVIL,MOTRIN) 600 MG tablet Take 1 tablet (600 mg total) by mouth every 6 (six) hours. 10/20/16   Candice Camp, MD  Prenatal Vit-Fe Fumarate-FA (PRENATAL MULTIVITAMIN) TABS tablet Take 1 tablet by mouth daily at 12 noon.    Historical Provider, MD  ranitidine (ZANTAC) 150 MG tablet Take 1 tablet (150 mg total) by mouth at bedtime. Patient not taking: Reported on 10/18/2016 08/02/16 08/02/17  Donette Larry, CNM    Family History No family history on file.  Social History Social History  Substance Use Topics  . Smoking status: Never Smoker  . Smokeless tobacco: Never Used  . Alcohol use No     Allergies   Patient has no known allergies.   Review of Systems Review of Systems  Eyes: Positive for pain (left), redness and visual disturbance (blurry vision).  Neurological: Negative for headaches.  All other systems reviewed and are negative.    Physical Exam Updated Vital Signs BP 111/63   Pulse 67   Temp 98.4 F (36.9 C) (Oral)   Resp 18   LMP 12/25/2016   SpO2 100%   Physical Exam  Constitutional: She appears well-developed and well-nourished.  HENT:  Head: Normocephalic and atraumatic.  Eyes: Conjunctivae, EOM and lids are normal. Pupils are equal, round, and reactive to light. Right eye exhibits no discharge. Left eye exhibits  no discharge.  Slit lamp exam:      The left eye shows no corneal abrasion, no corneal flare, no corneal ulcer, no foreign body and no fluorescein uptake.  Pt has pinguecula on the inner aspect of left eye.   Cardiovascular: Normal rate and regular rhythm.   Pulmonary/Chest: Effort normal. No respiratory distress.  Neurological: She is alert. Coordination normal.  Skin: Skin is warm and dry. No rash noted. She is not diaphoretic. No erythema.  Psychiatric: She has a normal mood and affect.  Nursing note and vitals reviewed.    ED Treatments / Results   DIAGNOSTIC STUDIES: Oxygen Saturation is 100% on  RA, normal by my interpretation.    COORDINATION OF CARE: 10:49 PM Discussed treatment plan with pt at bedside which includes episcleritis treatment and f/u with ophthalmologist and pt agreed to plan.   Labs (all labs ordered are listed, but only abnormal results are displayed) Labs Reviewed - No data to display  EKG  EKG Interpretation None       Radiology No results found.  Procedures Procedures (including critical care time)  Medications Ordered in ED Medications  dexamethasone (DECADRON) 0.1 % ophthalmic suspension 1 drop (1 drop Left Eye Given 12/28/16 0031)  fluorescein ophthalmic strip 1 strip (1 strip Left Eye Given by Other 12/27/16 2310)  tetracaine (PONTOCAINE) 0.5 % ophthalmic solution 2 drop (2 drops Left Eye Given by Other 12/27/16 2310)     Initial Impression / Assessment and Plan / ED Course  I have reviewed the triage vital signs and the nursing notes.  Pertinent labs & imaging results that were available during my care of the patient were reviewed by me and considered in my medical decision making (see chart for details).     Pt with episcleritis/pinguecula on exam.  Exam not concerning for orbital cellulitis, hyphema. No concern for uveitis. Patient will be discharged home with dexamethasone eye drops.   Patient understands to follow up with ophthalmology; return precautions discussed. Patient appears safe for discharged.   Final Clinical Impressions(s) / ED Diagnoses   Final diagnoses:  Pinguecula, left eye    New Prescriptions New Prescriptions   No medications on file    I personally performed the services described in this documentation, which was scribed in my presence. The recorded information has been reviewed and is accurate.      Michelle Mornavid Necie Wilcoxson, NP 12/28/16 [id number]    Arby BarretteMarcy Pfeiffer, MD 12/31/16 [phone]

## 2016-12-27 NOTE — ED Notes (Signed)
Wood lamp, tetracaine and opthalmic strip at bedside.

## 2016-12-27 NOTE — ED Triage Notes (Signed)
Pt presents with redness to left eye and reports pain to affected eye; onset tonight at 6pm.

## 2016-12-28 DIAGNOSIS — H11152 Pinguecula, left eye: Secondary | ICD-10-CM | POA: Diagnosis not present

## 2018-11-05 NOTE — L&D Delivery Note (Signed)
Delivery Note At 4:44 PM a viable female was delivered via  (Presentation: Right Occiput Posterior).  APGAR: 8, 9; weight 7 lb 3.9 oz (3285 g).   Placenta status: Spontaneous, Intact.  Cord: 3 vessels with the following complications: None.  Cord pH: n/a  Anesthesia: Epidural Episiotomy: None Lacerations: 2nd degree Suture Repair: 3.0 vicryl rapide Est. Blood Loss (mL):  300  Mom to postpartum.  Baby to Couplet care / Skin to Skin.  Linda Hedges 10/14/2019, 5:01 PM

## 2018-11-06 ENCOUNTER — Encounter (HOSPITAL_COMMUNITY): Payer: Self-pay

## 2018-11-06 ENCOUNTER — Emergency Department (HOSPITAL_COMMUNITY)
Admission: EM | Admit: 2018-11-06 | Discharge: 2018-11-07 | Disposition: A | Discharge status: Home / Self Care | Payer: 59 | Attending: Emergency Medicine | Admitting: Emergency Medicine

## 2018-11-06 ENCOUNTER — Other Ambulatory Visit: Payer: Self-pay

## 2018-11-06 DIAGNOSIS — B269 Mumps without complication: Secondary | ICD-10-CM | POA: Diagnosis not present

## 2018-11-06 DIAGNOSIS — K1120 Sialoadenitis, unspecified: Secondary | ICD-10-CM | POA: Diagnosis not present

## 2018-11-06 DIAGNOSIS — R51 Headache: Secondary | ICD-10-CM | POA: Diagnosis present

## 2018-11-06 NOTE — ED Triage Notes (Signed)
Pt presents with facial swelling x 1 day, pt states that she awoke this morning with a HA and face has been swelling throughout the day. Pt denies and dyspnea, no tongue swelling. Pt reports that she has facial tenderness and HA 8/10.

## 2018-11-07 ENCOUNTER — Emergency Department (HOSPITAL_COMMUNITY): Payer: 59

## 2018-11-07 ENCOUNTER — Encounter (HOSPITAL_COMMUNITY): Payer: Self-pay | Admitting: Emergency Medicine

## 2018-11-07 ENCOUNTER — Encounter (HOSPITAL_COMMUNITY): Payer: Self-pay

## 2018-11-07 ENCOUNTER — Emergency Department (HOSPITAL_COMMUNITY)
Admission: EM | Admit: 2018-11-07 | Discharge: 2018-11-07 | Disposition: A | Discharge status: Home / Self Care | Payer: 59 | Source: Home / Self Care | Attending: Emergency Medicine | Admitting: Emergency Medicine

## 2018-11-07 DIAGNOSIS — L259 Unspecified contact dermatitis, unspecified cause: Secondary | ICD-10-CM

## 2018-11-07 DIAGNOSIS — K1120 Sialoadenitis, unspecified: Secondary | ICD-10-CM | POA: Diagnosis not present

## 2018-11-07 LAB — URINALYSIS, ROUTINE W REFLEX MICROSCOPIC
BILIRUBIN URINE: NEGATIVE
Glucose, UA: NEGATIVE mg/dL
Hgb urine dipstick: NEGATIVE
KETONES UR: NEGATIVE mg/dL
Nitrite: NEGATIVE
Protein, ur: NEGATIVE mg/dL
Specific Gravity, Urine: 1.023 (ref 1.005–1.030)
pH: 5 (ref 5.0–8.0)

## 2018-11-07 LAB — I-STAT CHEM 8, ED
BUN: 10 mg/dL (ref 6–20)
CALCIUM ION: 1.14 mmol/L — AB (ref 1.15–1.40)
CHLORIDE: 106 mmol/L (ref 98–111)
Creatinine, Ser: 0.6 mg/dL (ref 0.44–1.00)
GLUCOSE: 102 mg/dL — AB (ref 70–99)
HCT: 34 % — ABNORMAL LOW (ref 36.0–46.0)
HEMOGLOBIN: 11.6 g/dL — AB (ref 12.0–15.0)
Potassium: 3.5 mmol/L (ref 3.5–5.1)
Sodium: 137 mmol/L (ref 135–145)
TCO2: 21 mmol/L — ABNORMAL LOW (ref 22–32)

## 2018-11-07 LAB — BASIC METABOLIC PANEL
ANION GAP: 8 (ref 5–15)
BUN: 11 mg/dL (ref 6–20)
CO2: 21 mmol/L — AB (ref 22–32)
Calcium: 8.8 mg/dL — ABNORMAL LOW (ref 8.9–10.3)
Chloride: 107 mmol/L (ref 98–111)
Creatinine, Ser: 0.65 mg/dL (ref 0.44–1.00)
GFR calc Af Amer: >60 mL/min (ref >60)
GFR calc non Af Amer: >60 mL/min (ref >60)
GLUCOSE: 103 mg/dL — AB (ref 70–99)
POTASSIUM: 3.6 mmol/L (ref 3.5–5.1)
Sodium: 136 mmol/L (ref 135–145)

## 2018-11-07 LAB — CBC WITH DIFFERENTIAL/PLATELET
ABS IMMATURE GRANULOCYTES: 0.02 10*3/uL (ref 0.00–0.07)
Basophils Absolute: 0 10*3/uL (ref 0.0–0.1)
Basophils Relative: 0 %
Eosinophils Absolute: 0.2 10*3/uL (ref 0.0–0.5)
Eosinophils Relative: 3 %
HEMATOCRIT: 33.6 % — AB (ref 36.0–46.0)
Hemoglobin: 10.2 g/dL — ABNORMAL LOW (ref 12.0–15.0)
IMMATURE GRANULOCYTES: 0 %
LYMPHS ABS: 1.6 10*3/uL (ref 0.7–4.0)
Lymphocytes Relative: 25 %
MCH: 24.7 pg — ABNORMAL LOW (ref 26.0–34.0)
MCHC: 30.4 g/dL (ref 30.0–36.0)
MCV: 81.4 fL (ref 80.0–100.0)
MONO ABS: 0.5 10*3/uL (ref 0.1–1.0)
MONOS PCT: 8 %
NEUTROS PCT: 64 %
Neutro Abs: 3.9 10*3/uL (ref 1.7–7.7)
Platelets: 198 10*3/uL (ref 150–400)
RBC: 4.13 MIL/uL (ref 3.87–5.11)
RDW: 14.2 % (ref 11.5–15.5)
WBC: 6.1 10*3/uL (ref 4.0–10.5)
nRBC: 0 % (ref 0.0–0.2)

## 2018-11-07 LAB — I-STAT BETA HCG BLOOD, ED (MC, WL, AP ONLY)

## 2018-11-07 MED ORDER — AMOXICILLIN-POT CLAVULANATE 875-125 MG PO TABS: 1.0000 | ORAL_TABLET | Freq: Two times a day (BID) | ORAL | 0 refills | Status: DC

## 2018-11-07 MED ORDER — SODIUM CHLORIDE (PF) 0.9 % IJ SOLN
INTRAMUSCULAR | Status: AC
  Filled 2018-11-07: qty 50

## 2018-11-07 MED ORDER — HYDROXYZINE HCL 25 MG PO TABS: 25.0000 mg | ORAL_TABLET | Freq: Four times a day (QID) | ORAL | 0 refills | Status: DC

## 2018-11-07 MED ORDER — SODIUM CHLORIDE 0.9 % IV BOLUS
1000.0000 mL | Freq: Once | INTRAVENOUS | Status: AC
  Administered 2018-11-07: 1000 mL via INTRAVENOUS

## 2018-11-07 MED ORDER — FENTANYL CITRATE (PF) 100 MCG/2ML IJ SOLN
50.0000 ug | Freq: Once | INTRAMUSCULAR | Status: AC
  Administered 2018-11-07: 50 ug via INTRAVENOUS
  Filled 2018-11-07: qty 2

## 2018-11-07 MED ORDER — PREDNISONE 20 MG PO TABS
60.0000 mg | ORAL_TABLET | Freq: Once | ORAL | Status: AC
  Administered 2018-11-07: 60 mg via ORAL
  Filled 2018-11-07: qty 3

## 2018-11-07 MED ORDER — IOHEXOL 300 MG/ML  SOLN
75.0000 mL | Freq: Once | INTRAMUSCULAR | Status: AC | PRN
  Administered 2018-11-07: 75 mL via INTRAVENOUS

## 2018-11-07 MED ORDER — ONDANSETRON HCL 4 MG/2ML IJ SOLN
4.0000 mg | Freq: Once | INTRAMUSCULAR | Status: AC
  Administered 2018-11-07: 4 mg via INTRAVENOUS
  Filled 2018-11-07: qty 2

## 2018-11-07 MED ORDER — HYDROXYZINE HCL 25 MG PO TABS
25.0000 mg | ORAL_TABLET | Freq: Once | ORAL | Status: AC
  Administered 2018-11-07: 25 mg via ORAL
  Filled 2018-11-07: qty 1

## 2018-11-07 MED ORDER — OXYCODONE-ACETAMINOPHEN 5-325 MG PO TABS: 1.0000 | ORAL_TABLET | ORAL | 0 refills | Status: DC | PRN

## 2018-11-07 MED ORDER — AMOXICILLIN-POT CLAVULANATE 875-125 MG PO TABS
1.0000 | ORAL_TABLET | Freq: Once | ORAL | Status: AC
  Administered 2018-11-07: 1 via ORAL
  Filled 2018-11-07: qty 1

## 2018-11-07 MED ORDER — PREDNISONE 10 MG PO TABS: 20.0000 mg | ORAL_TABLET | Freq: Every day | ORAL | 0 refills | Status: DC

## 2018-11-07 MED ORDER — OXYCODONE-ACETAMINOPHEN 5-325 MG PO TABS
1.0000 | ORAL_TABLET | Freq: Once | ORAL | Status: AC
  Administered 2018-11-07: 1 via ORAL
  Filled 2018-11-07: qty 1

## 2018-11-07 NOTE — ED Provider Notes (Signed)
[facility]-EMERGENCY DEPT Provider Note   CSN: [id number] Arrival date & time: 11/07/18  1713     History   Chief Complaint Chief Complaint  Patient presents with  . Allergic Reaction    HPI Michelle Goodman is a 23 y.o. female.   Allergic Reaction  Presenting symptoms: itching and rash (scalp)   Presenting symptoms: no difficulty breathing and no difficulty swallowing   Context: cosmetics    23 year old female presents with a 2-day history of a rash to her scalp.  She states symptoms started after her hair was permed when the dye stating too long.  She states she has had pruritus of the scalp, redness and swelling since then.  She was seen yesterday for the same [place] started on Augmentin.  She states at that time they tested her for mumps.  States swelling of her face improves when she stops itching.  Then when she starts itching again she gets increased swelling of the face and left eye.  She was seen by the allergist today and told that if Benadryl did not help she needs to come back to the ER.  She denies any difficulty swallowing, difficulty breathing, difficulty tolerating her secretions.  She states she has been taking Augmentin.  She notes Benadryl did not help her itching.  She applied hydrocortisone cream to affected area and that helped her itching significantly.  History reviewed. No pertinent past medical history.  Patient Active Problem List   Diagnosis Date Noted  . Indication for care in labor or delivery 10/18/2016    Past Surgical History:  Procedure Laterality Date  . dilate and evacutation       OB History    Gravida  2   Para  1   Term  1   Preterm      AB  1   Living  1     SAB  1   TAB      Ectopic      Multiple  0   Live Births  1            Home Medications    Prior to Admission medications   Medication Sig Start Date End Date Taking? Authorizing Provider  amoxicillin-clavulanate (AUGMENTIN) 875-125  MG tablet Take 1 tablet by mouth 2 (two) times daily. One po bid x 7 days 11/07/18   Arthor CaptainHarris, Abigail, PA-C  ibuprofen (ADVIL,MOTRIN) 600 MG tablet Take 1 tablet (600 mg total) by mouth every 6 (six) hours. Patient not taking: Reported on 11/06/2018 10/20/16   Candice CampLowe, David, MD  oxyCODONE-acetaminophen (PERCOCET) 5-325 MG tablet Take 1-2 tablets by mouth every 4 (four) hours as needed. 11/07/18   Arthor CaptainHarris, Abigail, PA-C  ranitidine (ZANTAC) 150 MG tablet Take 1 tablet (150 mg total) by mouth at bedtime. Patient not taking: Reported on 10/18/2016 08/02/16 08/02/17  Donette LarryBhambri, Melanie, CNM    Family History No family history on file.  Social History Social History   Tobacco Use  . Smoking status: Never Smoker  . Smokeless tobacco: Never Used  Substance Use Topics  . Alcohol use: No  . Drug use: No     Allergies   Patient has no known allergies.   Review of Systems Review of Systems  Constitutional: Negative for chills and fever.  HENT: Positive for facial swelling. Negative for trouble swallowing.   Respiratory: Negative for shortness of breath.   Cardiovascular: Negative for chest pain.  Gastrointestinal: Negative for abdominal pain, nausea and vomiting.  Skin:  Positive for itching and rash (scalp).     Physical Exam Updated Vital Signs BP (!) 151/87 (BP Location: Right Arm)   Pulse 87   Temp 99.1 F (37.3 C) (Oral)   Resp 18   LMP 10/13/2018 Comment: negative beta HCG 11/07/17  SpO2 100%   Physical Exam Vitals signs and nursing note reviewed.  Constitutional:      Appearance: She is well-developed.  HENT:     Head: Normocephalic and atraumatic.     Jaw: Swelling (L>R parotid gland) present. No trismus, tenderness or malocclusion.  Eyes:     Conjunctiva/sclera: Conjunctivae normal.  Neck:     Musculoskeletal: Neck supple.  Cardiovascular:     Rate and Rhythm: Normal rate and regular rhythm.     Heart sounds: Normal heart sounds. No murmur.  Pulmonary:     Effort:  Pulmonary effort is normal. No respiratory distress.     Breath sounds: Normal breath sounds. No wheezing or rales.  Abdominal:     General: Bowel sounds are normal. There is no distension.     Palpations: Abdomen is soft.     Tenderness: There is no abdominal tenderness.  Musculoskeletal: Normal range of motion.        General: No tenderness or deformity.  Skin:    General: Skin is warm and dry.     Findings: Erythema (to the front half of the scalp) and rash present. No abscess, burn, lesion, petechiae or wound. Rash is papular and purpuric.  Neurological:     Mental Status: She is alert and oriented to person, place, and time.  Psychiatric:        Behavior: Behavior normal.      ED Treatments / Results  Labs (all labs ordered are listed, but only abnormal results are displayed) Labs Reviewed - No data to display  EKG None  Radiology Ct Maxillofacial W Contrast  Result Date: 11/07/2018 CLINICAL DATA:  Facial swelling for 1 day EXAM: CT MAXILLOFACIAL WITH CONTRAST TECHNIQUE: Multidetector CT imaging of the maxillofacial structures was performed with intravenous contrast. Multiplanar CT image reconstructions were also generated. CONTRAST:  39mL OMNIPAQUE IOHEXOL 300 MG/ML  SOLN COMPARISON:  None. FINDINGS: Osseous: No facial fracture. Dental: Unremarkable appearance of the teeth, mandible and hard palate. Orbits: The globes are intact. Normal appearance of the intra- and extraconal fat. Symmetric extraocular muscles. Sinuses: No fluid levels or advanced mucosal thickening. Soft tissues: There is superficial inflammatory induration within subcutaneous fat overlying both parotid glands. There are numerous bilateral level 2A cervical lymph nodes, measuring up to 7 mm. Inflammatory changes are worse on the left. There is no sialolithiasis. No abnormality along the course of the parotid ducts. Limited intracranial: Normal. IMPRESSION: 1. Inflammatory change superficial to both parotid glands,  left-greater-than-right, consistent with acute sialadenitis. This includes the possibility of mumps in the appropriate population. 2. Reactive bilateral upper cervical lymphadenopathy. Electronically Signed   By: Deatra Robinson M.D.   On: 11/07/2018 04:04    Procedures Procedures (including critical care time)  Medications Ordered in ED Medications  hydrOXYzine (ATARAX/VISTARIL) tablet 25 mg (25 mg Oral Given 11/07/18 1813)  predniSONE (DELTASONE) tablet 60 mg (60 mg Oral Given 11/07/18 1813)     Initial Impression / Assessment and Plan / ED Course  I have reviewed the triage vital signs and the nursing notes.  Pertinent labs & imaging results that were available during my care of the patient were reviewed by me and considered in my medical decision making (see  chart for details).     Patient presents with facial swelling and itching of her scalp.  He is resting comfortably in bed, no acute distress, nontoxic, non-lethargic.  Vital signs stable.  She was seen yesterday for facial swelling and diagnosed sailadenitis.  There is concern for possible mumps so she was tested, those tests are still pending.  She states that her swelling is associated with the scalp erythema and itching.  She does have erythema noted of the front half of her scalp with associated papules.  Possibly allergic in etiology given pruritus.  Will start on steroids and will write for hydroxyzine.  Patient given strict return precautions.  Instructed to continue taking Augmentin.  She expresses understanding.  She is ready and stable for discharge.  Final Clinical Impressions(s) / ED Diagnoses   Final diagnoses:  None    ED Discharge Orders    None       Rueben Bash 11/07/18 2148    Arby Barrette, MD 11/09/18 2214

## 2018-11-07 NOTE — Discharge Instructions (Signed)
Take prednisone as prescribed.  Take hydroxyzine as needed for itching.  Apply hydrocortisone cream to affected area 2-3 times a day.  Continue taking Augmentin as previously prescribed.  Return to the ED immediately for new or worsening symptoms or concerns, such as fevers, worsening rash, new or worsening facial swelling, difficulty swallowing, difficulty breathing or any concerns at all.

## 2018-11-07 NOTE — ED Notes (Signed)
Pt aware of urine sample. Pt wants to wait to see MD before getting up to BR.

## 2018-11-07 NOTE — ED Notes (Signed)
Pt transported to CT ?

## 2018-11-07 NOTE — ED Notes (Signed)
Patient came out of the room wondering what she was waiting on and how much longer. Explained to patient PA is coming to see her soon. Patient states , "everyone keeps saying I have mumps and I don't."

## 2018-11-07 NOTE — ED Provider Notes (Signed)
[facility]-EMERGENCY DEPT Provider Note   CSN: [id number] Arrival date & time: 11/06/18  2144     History   Chief Complaint Chief Complaint  Patient presents with  . Facial Swelling    HPI Michelle Goodman is a 23 y.o. female who presents the emergency department chief complaint of headache and facial pain.  Patient states that earlier today she noticed some swelling over the left side of her face.  Throughout the day it became significantly worse with swelling on both sides of her face worse on the left side involving the parotid region and the temporalis.  She complains of a throbbing headache on the left side.  She denies photophobia, phonophobia flulike symptoms.  She is gotten all of her childhood immunizations.  She denies dental pain, ear pain or oral swelling.  She denies trismus or change in phonation.  Patient did recently have her hair permed and died however she denies any itching scaling rash or involvement of her scalp.  HPI  History reviewed. No pertinent past medical history.  Patient Active Problem List   Diagnosis Date Noted  . Indication for care in labor or delivery 10/18/2016    Past Surgical History:  Procedure Laterality Date  . dilate and evacutation       OB History    Gravida  2   Para  1   Term  1   Preterm      AB  1   Living  1     SAB  1   TAB      Ectopic      Multiple  0   Live Births  1            Home Medications    Prior to Admission medications   Medication Sig Start Date End Date Taking? Authorizing Provider  ibuprofen (ADVIL,MOTRIN) 600 MG tablet Take 1 tablet (600 mg total) by mouth every 6 (six) hours. Patient not taking: Reported on 11/06/2018 10/20/16   Candice Camp, MD  ranitidine (ZANTAC) 150 MG tablet Take 1 tablet (150 mg total) by mouth at bedtime. Patient not taking: Reported on 10/18/2016 08/02/16 08/02/17  Donette Larry, CNM    Family History History reviewed. No pertinent  family history.  Social History Social History   Tobacco Use  . Smoking status: Never Smoker  . Smokeless tobacco: Never Used  Substance Use Topics  . Alcohol use: No  . Drug use: No     Allergies   Patient has no known allergies.   Review of Systems Review of Systems  Ten systems reviewed and are negative for acute change, except as noted in the HPI.   Physical Exam Updated Vital Signs BP 112/61   Pulse 77   Temp 98 F (36.7 C)   Resp 20   Ht 5\' 4"  (1.626 m)   Wt 67.1 kg   LMP 10/13/2018   SpO2 100%   BMI 25.40 kg/m   Physical Exam Vitals signs and nursing note reviewed. Exam conducted with a chaperone present.  Constitutional:      General: She is not in acute distress.    Appearance: She is well-developed. She is not diaphoretic.  HENT:     Head: Normocephalic and atraumatic. Hair is normal.     Jaw: Trismus present. No malocclusion.     Comments: Patient with edema over the left parotid, extending into the left temporalis region.  Swelling over the right lateral face.  No rashes. Bilateral  TMs normal.    Right Ear: External ear normal.     Left Ear: External ear normal.     Mouth/Throat:     Pharynx: No oropharyngeal exudate.  Eyes:     General: No scleral icterus.    Conjunctiva/sclera: Conjunctivae normal.     Pupils: Pupils are equal, round, and reactive to light.  Neck:     Musculoskeletal: Normal range of motion and neck supple.     Thyroid: No thyromegaly.     Vascular: No JVD.  Cardiovascular:     Rate and Rhythm: Normal rate and regular rhythm.     Heart sounds: Normal heart sounds. No murmur. No friction rub. No gallop.   Pulmonary:     Effort: Pulmonary effort is normal. No respiratory distress.     Breath sounds: Normal breath sounds.  Abdominal:     General: Bowel sounds are normal. There is no distension.     Palpations: Abdomen is soft. There is no mass.     Tenderness: There is no abdominal tenderness. There is no guarding.    Musculoskeletal: Normal range of motion.        General: No tenderness.     Comments: No meningismus  Skin:    General: Skin is warm and dry.     Findings: No rash.  Neurological:     Mental Status: She is alert and oriented to person, place, and time.     Cranial Nerves: No cranial nerve deficit.     Coordination: Coordination normal.     Deep Tendon Reflexes: Reflexes are normal and symmetric.      ED Treatments / Results  Labs (all labs ordered are listed, but only abnormal results are displayed) Labs Reviewed  CBC WITH DIFFERENTIAL/PLATELET - Abnormal; Notable for the following components:      Result Value   Hemoglobin 10.2 (*)    HCT 33.6 (*)    MCH 24.7 (*)    All other components within normal limits  I-STAT CHEM 8, ED - Abnormal; Notable for the following components:   Glucose, Bld 102 (*)    Calcium, Ion 1.14 (*)    TCO2 21 (*)    Hemoglobin 11.6 (*)    HCT 34.0 (*)    All other components within normal limits  BASIC METABOLIC PANEL  URINALYSIS, ROUTINE W REFLEX MICROSCOPIC  I-STAT BETA HCG BLOOD, ED ([facility], [facility], [facility] ONLY)    EKG None  Radiology No results found.  Procedures Procedures (including critical care time)  Medications Ordered in ED Medications  sodium chloride 0.9 % bolus 1,000 mL (1,000 mLs Intravenous New Bag/Given 11/07/18 0036)  fentaNYL (SUBLIMAZE) injection 50 mcg (has no administration in time range)  ondansetron (ZOFRAN) injection 4 mg (has no administration in time range)     Initial Impression / Assessment and Plan / ED Course  I have reviewed the triage vital signs and the nursing notes.  Pertinent labs & imaging results that were available during my care of the patient were reviewed by me and considered in my medical decision making (see chart for details).     Patient with marked swelling of the salivary glands and parotid glands.  CT scan shows the same.  No evidence sialolith.  Given recent outbreak of mumps at surrounding  colleges in the area I have concern that this may reflect mumps parotitis and a vaccinated individual.  Patient had IgG and IgM titers drawn.  We will also cover with Augmentin for potential bacterial  infection.  Pain has improved significantly with pain control here.  To be discharged with a small amount of Percocet and Augmentin.  Discussed return precautions.  Discussed flexion prevention. PDMP reviewed during this encounter.   Final Clinical Impressions(s) / ED Diagnoses   Final diagnoses:  Sialadenitis  Mumps without complication    ED Discharge Orders    None       Arthor CaptainHarris, Jeilyn Reznik, PA-C 11/07/18 0703    Shaune PollackIsaacs, [place], MD 11/07/18 1907

## 2018-11-07 NOTE — ED Triage Notes (Addendum)
Per pt, states she was here yesterday for the same complaint-states she must be allergic to the hair dye-states scalp itches and left eye is swollen-was told to come back if symptoms worsened-took 2 benadryl but it has'nt helped

## 2018-11-07 NOTE — Discharge Instructions (Signed)
Get help right away if: You have pain and swelling in your face, jaw, or mouth, and this is getting worse. Your pain and swelling make it hard to swallow or breathe.   Mumps in [place] who previously had one or two doses of MMR vaccine can still get mumps and transmit the disease. During mumps outbreaks in highly vaccinated communities, the proportion of cases that occur among people who have been vaccinated may be high. This does not mean that the vaccine is ineffective. The effectiveness of the vaccine is assessed by comparing the attack rate in people who are vaccinated with the attack rate in those who have not been vaccinated. In outbreaks of highly vaccinated populations, people who have not been vaccinated against mumps usually have a much greater mumps attack rate than those who have been fully vaccinated. Disease symptoms are generally milder and complications are less frequent in vaccinated people.

## 2018-11-08 LAB — MUMPS ANTIBODY, IGG: Mumps IgG: 142 AU/mL (ref >10.9)

## 2018-11-11 LAB — MUMPS ANTIBODY, IGM: Mumps IgM: <0.8 AU (ref 0.00–0.79)

## 2019-03-06 LAB — OB RESULTS CONSOLE GC/CHLAMYDIA
Chlamydia: NEGATIVE
Gonorrhea: NEGATIVE

## 2019-03-06 LAB — OB RESULTS CONSOLE RUBELLA ANTIBODY, IGM: Rubella: IMMUNE

## 2019-03-06 LAB — OB RESULTS CONSOLE ABO/RH: RH Type: POSITIVE

## 2019-03-06 LAB — OB RESULTS CONSOLE HIV ANTIBODY (ROUTINE TESTING): HIV: NONREACTIVE

## 2019-03-06 LAB — OB RESULTS CONSOLE ANTIBODY SCREEN: Antibody Screen: NEGATIVE

## 2019-03-06 LAB — OB RESULTS CONSOLE RPR: RPR: NONREACTIVE

## 2019-03-06 LAB — OB RESULTS CONSOLE HEPATITIS B SURFACE ANTIGEN: Hepatitis B Surface Ag: NEGATIVE

## 2019-08-17 ENCOUNTER — Other Ambulatory Visit (HOSPITAL_COMMUNITY): Payer: Self-pay

## 2019-08-18 ENCOUNTER — Other Ambulatory Visit: Payer: Self-pay

## 2019-08-18 ENCOUNTER — Ambulatory Visit (HOSPITAL_COMMUNITY)
Admission: RE | Admit: 2019-08-18 | Discharge: 2019-08-18 | Disposition: A | Discharge status: Home / Self Care | Payer: Federal, State, Local not specified - PPO | Source: Ambulatory Visit | Attending: Obstetrics and Gynecology | Admitting: Obstetrics and Gynecology

## 2019-08-18 DIAGNOSIS — D509 Iron deficiency anemia, unspecified: Secondary | ICD-10-CM | POA: Diagnosis present

## 2019-08-18 MED ORDER — SODIUM CHLORIDE 0.9 % IV SOLN
510.0000 mg | INTRAVENOUS | Status: DC
  Administered 2019-08-18: 510 mg via INTRAVENOUS
  Filled 2019-08-18: qty 17

## 2019-08-18 NOTE — Discharge Instructions (Signed)

## 2019-08-21 ENCOUNTER — Other Ambulatory Visit: Payer: Self-pay

## 2019-08-21 ENCOUNTER — Encounter (HOSPITAL_COMMUNITY)
Admission: RE | Admit: 2019-08-21 | Discharge: 2019-08-21 | Disposition: A | Discharge status: Home / Self Care | Payer: Federal, State, Local not specified - PPO | Source: Ambulatory Visit | Attending: Obstetrics and Gynecology | Admitting: Obstetrics and Gynecology

## 2019-08-21 DIAGNOSIS — D649 Anemia, unspecified: Secondary | ICD-10-CM | POA: Insufficient documentation

## 2019-08-21 MED ORDER — SODIUM CHLORIDE 0.9 % IV SOLN
510.0000 mg | INTRAVENOUS | Status: DC
  Administered 2019-08-21: 510 mg via INTRAVENOUS
  Filled 2019-08-21: qty 510

## 2019-09-16 LAB — OB RESULTS CONSOLE GBS: GBS: POSITIVE

## 2019-09-29 ENCOUNTER — Telehealth (HOSPITAL_COMMUNITY): Payer: Self-pay | Admitting: *Deleted

## 2019-09-29 ENCOUNTER — Encounter (HOSPITAL_COMMUNITY): Payer: Self-pay | Admitting: *Deleted

## 2019-09-29 NOTE — Telephone Encounter (Signed)
Preadmission screen  

## 2019-10-06 ENCOUNTER — Encounter (HOSPITAL_COMMUNITY): Payer: Self-pay | Admitting: *Deleted

## 2019-10-12 ENCOUNTER — Other Ambulatory Visit (HOSPITAL_COMMUNITY)
Admission: RE | Admit: 2019-10-12 | Discharge: 2019-10-12 | Disposition: A | Discharge status: Home / Self Care | Payer: Federal, State, Local not specified - PPO | Source: Ambulatory Visit | Attending: Obstetrics & Gynecology | Admitting: Obstetrics & Gynecology

## 2019-10-12 DIAGNOSIS — Z01812 Encounter for preprocedural laboratory examination: Secondary | ICD-10-CM | POA: Insufficient documentation

## 2019-10-12 DIAGNOSIS — Z20828 Contact with and (suspected) exposure to other viral communicable diseases: Secondary | ICD-10-CM | POA: Diagnosis not present

## 2019-10-12 LAB — SARS CORONAVIRUS 2 (TAT 6-24 HRS): SARS Coronavirus 2: NEGATIVE

## 2019-10-14 ENCOUNTER — Inpatient Hospital Stay (HOSPITAL_COMMUNITY)
Admission: AD | Admit: 2019-10-14 | Discharge: 2019-10-16 | DRG: 807 | Disposition: A | Discharge status: Home / Self Care | Payer: Federal, State, Local not specified - PPO | Attending: Obstetrics & Gynecology | Admitting: Obstetrics & Gynecology

## 2019-10-14 ENCOUNTER — Inpatient Hospital Stay (HOSPITAL_COMMUNITY): Payer: Federal, State, Local not specified - PPO | Admitting: Anesthesiology

## 2019-10-14 ENCOUNTER — Inpatient Hospital Stay (HOSPITAL_COMMUNITY): Payer: Federal, State, Local not specified - PPO

## 2019-10-14 ENCOUNTER — Encounter (HOSPITAL_COMMUNITY): Payer: Self-pay | Admitting: *Deleted

## 2019-10-14 ENCOUNTER — Other Ambulatory Visit: Payer: Self-pay

## 2019-10-14 DIAGNOSIS — Z3A39 39 weeks gestation of pregnancy: Secondary | ICD-10-CM | POA: Diagnosis not present

## 2019-10-14 DIAGNOSIS — O99824 Streptococcus B carrier state complicating childbirth: Principal | ICD-10-CM | POA: Diagnosis present

## 2019-10-14 DIAGNOSIS — O9902 Anemia complicating childbirth: Secondary | ICD-10-CM | POA: Diagnosis present

## 2019-10-14 DIAGNOSIS — D509 Iron deficiency anemia, unspecified: Secondary | ICD-10-CM | POA: Diagnosis present

## 2019-10-14 DIAGNOSIS — O26893 Other specified pregnancy related conditions, third trimester: Secondary | ICD-10-CM | POA: Diagnosis present

## 2019-10-14 DIAGNOSIS — O99344 Other mental disorders complicating childbirth: Secondary | ICD-10-CM | POA: Diagnosis present

## 2019-10-14 DIAGNOSIS — F419 Anxiety disorder, unspecified: Secondary | ICD-10-CM | POA: Diagnosis present

## 2019-10-14 DIAGNOSIS — Z3490 Encounter for supervision of normal pregnancy, unspecified, unspecified trimester: Secondary | ICD-10-CM

## 2019-10-14 LAB — CBC
HCT: 32 % — ABNORMAL LOW (ref 36.0–46.0)
HCT: 32.5 % — ABNORMAL LOW (ref 36.0–46.0)
Hemoglobin: 10.1 g/dL — ABNORMAL LOW (ref 12.0–15.0)
Hemoglobin: 10.1 g/dL — ABNORMAL LOW (ref 12.0–15.0)
MCH: 26.8 pg (ref 26.0–34.0)
MCH: 26.7 pg (ref 26.0–34.0)
MCHC: 31.6 g/dL (ref 30.0–36.0)
MCHC: 31.1 g/dL (ref 30.0–36.0)
MCV: 84.9 fL (ref 80.0–100.0)
MCV: 86 fL (ref 80.0–100.0)
Platelets: 132 10*3/uL — ABNORMAL LOW (ref 150–400)
Platelets: 118 10*3/uL — ABNORMAL LOW (ref 150–400)
RBC: 3.77 MIL/uL — ABNORMAL LOW (ref 3.87–5.11)
RBC: 3.78 MIL/uL — ABNORMAL LOW (ref 3.87–5.11)
RDW: 15.1 % (ref 11.5–15.5)
RDW: 15 % (ref 11.5–15.5)
WBC: 6.3 10*3/uL (ref 4.0–10.5)
WBC: 7.2 10*3/uL (ref 4.0–10.5)
nRBC: 0 % (ref 0.0–0.2)
nRBC: 0 % (ref 0.0–0.2)

## 2019-10-14 LAB — TYPE AND SCREEN
ABO/RH(D): O POS
Antibody Screen: NEGATIVE

## 2019-10-14 LAB — ABO/RH: ABO/RH(D): O POS

## 2019-10-14 MED ORDER — DIPHENHYDRAMINE HCL 50 MG/ML IJ SOLN: 12.5000 mg | INTRAMUSCULAR | Status: DC | PRN

## 2019-10-14 MED ORDER — SIMETHICONE 80 MG PO CHEW: 80.0000 mg | CHEWABLE_TABLET | ORAL | Status: DC | PRN

## 2019-10-14 MED ORDER — ONDANSETRON HCL 4 MG/2ML IJ SOLN: 4.0000 mg | INTRAMUSCULAR | Status: DC | PRN

## 2019-10-14 MED ORDER — SODIUM CHLORIDE 0.9 % IV SOLN
5.0000 10*6.[IU] | Freq: Once | INTRAVENOUS | Status: AC
  Administered 2019-10-14: 5 10*6.[IU] via INTRAVENOUS
  Filled 2019-10-14: qty 5

## 2019-10-14 MED ORDER — IBUPROFEN 600 MG PO TABS
600.0000 mg | ORAL_TABLET | Freq: Four times a day (QID) | ORAL | Status: DC
  Administered 2019-10-14 – 2019-10-16 (×7): 600 mg via ORAL
  Filled 2019-10-14 (×7): qty 1

## 2019-10-14 MED ORDER — SODIUM CHLORIDE (PF) 0.9 % IJ SOLN
INTRAMUSCULAR | Status: DC | PRN
  Administered 2019-10-14: 12 mL/h via EPIDURAL

## 2019-10-14 MED ORDER — COCONUT OIL OIL: 1.0000 "application " | TOPICAL_OIL | Status: DC | PRN

## 2019-10-14 MED ORDER — EPHEDRINE 5 MG/ML INJ: 10.0000 mg | INTRAVENOUS | Status: DC | PRN

## 2019-10-14 MED ORDER — OXYCODONE-ACETAMINOPHEN 5-325 MG PO TABS: 2.0000 | ORAL_TABLET | ORAL | Status: DC | PRN

## 2019-10-14 MED ORDER — LACTATED RINGERS IV SOLN: 500.0000 mL | Freq: Once | INTRAVENOUS | Status: DC

## 2019-10-14 MED ORDER — OXYTOCIN 40 UNITS IN NORMAL SALINE INFUSION - SIMPLE MED: 2.5000 [IU]/h | INTRAVENOUS | Status: DC

## 2019-10-14 MED ORDER — PHENYLEPHRINE 40 MCG/ML (10ML) SYRINGE FOR IV PUSH (FOR BLOOD PRESSURE SUPPORT): 80.0000 ug | PREFILLED_SYRINGE | INTRAVENOUS | Status: DC | PRN

## 2019-10-14 MED ORDER — EPHEDRINE 5 MG/ML INJ
10.0000 mg | INTRAVENOUS | Status: DC | PRN
  Filled 2019-10-14: qty 10

## 2019-10-14 MED ORDER — ZOLPIDEM TARTRATE 5 MG PO TABS: 5.0000 mg | ORAL_TABLET | Freq: Every evening | ORAL | Status: DC | PRN

## 2019-10-14 MED ORDER — PENICILLIN G POT IN DEXTROSE 60000 UNIT/ML IV SOLN
3.0000 10*6.[IU] | INTRAVENOUS | Status: DC
  Administered 2019-10-14: 3 10*6.[IU] via INTRAVENOUS
  Filled 2019-10-14: qty 50

## 2019-10-14 MED ORDER — OXYTOCIN BOLUS FROM INFUSION
500.0000 mL | Freq: Once | INTRAVENOUS | Status: AC
  Administered 2019-10-14: 500 mL via INTRAVENOUS

## 2019-10-14 MED ORDER — WITCH HAZEL-GLYCERIN EX PADS: 1.0000 "application " | MEDICATED_PAD | CUTANEOUS | Status: DC | PRN

## 2019-10-14 MED ORDER — PHENYLEPHRINE 40 MCG/ML (10ML) SYRINGE FOR IV PUSH (FOR BLOOD PRESSURE SUPPORT)
80.0000 ug | PREFILLED_SYRINGE | INTRAVENOUS | Status: DC | PRN
  Filled 2019-10-14: qty 10

## 2019-10-14 MED ORDER — OXYTOCIN 40 UNITS IN NORMAL SALINE INFUSION - SIMPLE MED
1.0000 m[IU]/min | INTRAVENOUS | Status: DC
  Administered 2019-10-14: 2 m[IU]/min via INTRAVENOUS
  Filled 2019-10-14: qty 1000

## 2019-10-14 MED ORDER — LIDOCAINE HCL (PF) 1 % IJ SOLN
INTRAMUSCULAR | Status: DC | PRN
  Administered 2019-10-14: 5 mL via EPIDURAL

## 2019-10-14 MED ORDER — FENTANYL-BUPIVACAINE-NACL 0.5-0.125-0.9 MG/250ML-% EP SOLN
12.0000 mL/h | EPIDURAL | Status: DC | PRN
  Filled 2019-10-14: qty 250

## 2019-10-14 MED ORDER — OXYCODONE-ACETAMINOPHEN 5-325 MG PO TABS
1.0000 | ORAL_TABLET | ORAL | Status: DC | PRN
  Administered 2019-10-15 – 2019-10-16 (×2): 1 via ORAL
  Filled 2019-10-14 (×2): qty 1

## 2019-10-14 MED ORDER — LIDOCAINE HCL (PF) 1 % IJ SOLN: 30.0000 mL | INTRAMUSCULAR | Status: DC | PRN

## 2019-10-14 MED ORDER — ONDANSETRON HCL 4 MG PO TABS: 4.0000 mg | ORAL_TABLET | ORAL | Status: DC | PRN

## 2019-10-14 MED ORDER — DIBUCAINE (PERIANAL) 1 % EX OINT: 1.0000 "application " | TOPICAL_OINTMENT | CUTANEOUS | Status: DC | PRN

## 2019-10-14 MED ORDER — ONDANSETRON HCL 4 MG/2ML IJ SOLN: 4.0000 mg | Freq: Four times a day (QID) | INTRAMUSCULAR | Status: DC | PRN

## 2019-10-14 MED ORDER — ACETAMINOPHEN 325 MG PO TABS: 650.0000 mg | ORAL_TABLET | ORAL | Status: DC | PRN

## 2019-10-14 MED ORDER — DIPHENHYDRAMINE HCL 25 MG PO CAPS: 25.0000 mg | ORAL_CAPSULE | Freq: Four times a day (QID) | ORAL | Status: DC | PRN

## 2019-10-14 MED ORDER — TETANUS-DIPHTH-ACELL PERTUSSIS 5-2.5-18.5 LF-MCG/0.5 IM SUSP: 0.5000 mL | Freq: Once | INTRAMUSCULAR | Status: DC

## 2019-10-14 MED ORDER — TERBUTALINE SULFATE 1 MG/ML IJ SOLN: 0.2500 mg | Freq: Once | INTRAMUSCULAR | Status: DC | PRN

## 2019-10-14 MED ORDER — SOD CITRATE-CITRIC ACID 500-334 MG/5ML PO SOLN: 30.0000 mL | ORAL | Status: DC | PRN

## 2019-10-14 MED ORDER — LACTATED RINGERS IV SOLN: 500.0000 mL | INTRAVENOUS | Status: DC | PRN

## 2019-10-14 MED ORDER — SENNOSIDES-DOCUSATE SODIUM 8.6-50 MG PO TABS
2.0000 | ORAL_TABLET | ORAL | Status: DC
  Administered 2019-10-15 (×2): 2 via ORAL
  Filled 2019-10-14 (×2): qty 2

## 2019-10-14 MED ORDER — ACETAMINOPHEN 325 MG PO TABS
650.0000 mg | ORAL_TABLET | ORAL | Status: DC | PRN
  Administered 2019-10-15 – 2019-10-16 (×3): 650 mg via ORAL
  Filled 2019-10-14 (×3): qty 2

## 2019-10-14 MED ORDER — FENTANYL CITRATE (PF) 100 MCG/2ML IJ SOLN: 50.0000 ug | INTRAMUSCULAR | Status: DC | PRN

## 2019-10-14 MED ORDER — OXYCODONE-ACETAMINOPHEN 5-325 MG PO TABS: 1.0000 | ORAL_TABLET | ORAL | Status: DC | PRN

## 2019-10-14 MED ORDER — PRENATAL MULTIVITAMIN CH
1.0000 | ORAL_TABLET | Freq: Every day | ORAL | Status: DC
  Administered 2019-10-15: 1 via ORAL
  Filled 2019-10-14: qty 1

## 2019-10-14 MED ORDER — BENZOCAINE-MENTHOL 20-0.5 % EX AERO
1.0000 "application " | INHALATION_SPRAY | CUTANEOUS | Status: DC | PRN
  Administered 2019-10-15: 1 via TOPICAL
  Filled 2019-10-14: qty 56

## 2019-10-14 MED ORDER — LACTATED RINGERS IV SOLN
INTRAVENOUS | Status: DC
  Administered 2019-10-14 (×2): via INTRAVENOUS

## 2019-10-14 NOTE — Anesthesia Procedure Notes (Signed)
Epidural Patient location during procedure: OB Start time: 10/14/2019 2:37 PM End time: 10/14/2019 2:43 PM  Staffing Anesthesiologist: Effie Berkshire, MD Performed: anesthesiologist   Preanesthetic Checklist Completed: patient identified, site marked, surgical consent, pre-op evaluation, timeout performed, IV checked, risks and benefits discussed and monitors and equipment checked  Epidural Patient position: sitting Prep: ChloraPrep Patient monitoring: heart rate, continuous pulse ox and blood pressure Approach: midline Location: L3-L4 Injection technique: LOR saline  Needle:  Needle type: Tuohy  Needle gauge: 17 G Needle length: 9 cm Catheter type: closed end flexible Catheter size: 20 Guage Test dose: negative and 1.5% lidocaine  Assessment Events: blood not aspirated, injection not painful, no injection resistance and no paresthesia  Additional Notes LOR @ 4  Patient identified. Risks/Benefits/Options discussed with patient including but not limited to bleeding, infection, nerve damage, paralysis, failed block, incomplete pain control, headache, blood pressure changes, nausea, vomiting, reactions to medications, itching and postpartum back pain. Confirmed with bedside nurse the patient's most recent platelet count. Confirmed with patient that they are not currently taking any anticoagulation, have any bleeding history or any family history of bleeding disorders. Patient expressed understanding and wished to proceed. All questions were answered. Sterile technique was used throughout the entire procedure. Please see nursing notes for vital signs. Test dose was given through epidural catheter and negative prior to continuing to dose epidural or start infusion. Warning signs of high block given to the patient including shortness of breath, tingling/numbness in hands, complete motor block, or any concerning symptoms with instructions to call for help. Patient was given instructions on  fall risk and not to get out of bed. All questions and concerns addressed with instructions to call with any issues or inadequate analgesia.    Reason for block:procedure for pain

## 2019-10-14 NOTE — Anesthesia Preprocedure Evaluation (Signed)
Anesthesia Evaluation  Patient identified by MRN, date of birth, ID band Patient awake    Reviewed: Allergy & Precautions, Patient's Chart, lab work & pertinent test results  Airway Mallampati: I       Dental no notable dental hx.    Pulmonary neg pulmonary ROS,    Pulmonary exam normal        Cardiovascular negative cardio ROS Normal cardiovascular exam     Neuro/Psych negative neurological ROS  negative psych ROS   GI/Hepatic negative GI ROS, Neg liver ROS,   Endo/Other  negative endocrine ROS  Renal/GU negative Renal ROS     Musculoskeletal negative musculoskeletal ROS (+)   Abdominal   Peds  Hematology negative hematology ROS (+)   Anesthesia Other Findings   Reproductive/Obstetrics (+) Pregnancy                             Anesthesia Physical Anesthesia Plan  ASA: II  Anesthesia Plan: Epidural   Post-op Pain Management:    Induction:   PONV Risk Score and Plan:   Airway Management Planned: Natural Airway  Additional Equipment: None  Intra-op Plan:   Post-operative Plan:   Informed Consent: I have reviewed the patients History and Physical, chart, labs and discussed the procedure including the risks, benefits and alternatives for the proposed anesthesia with the patient or authorized representative who has indicated his/her understanding and acceptance.       Plan Discussed with:   Anesthesia Plan Comments: (Lab Results      Component                Value               Date                      WBC                      6.3                 10/14/2019                HGB                      10.1 (L)            10/14/2019                HCT                      32.0 (L)            10/14/2019                MCV                      84.9                10/14/2019                PLT                      132 (L)             10/14/2019           )         Anesthesia Quick Evaluation

## 2019-10-14 NOTE — H&P (Signed)
Michelle Goodman is a 23 y.o. female G3P1011 at [redacted]w[redacted]d presenting for elective IOL.  Antepartum course complicated by h/o IUFD at 21 weeks in first pregnancy.  Patient has h/o anxiety well controlled on sertraline.  Also, patient has h/o iron deficiency anemia with IV iron x 2 this pregnancy.  GBS positive.   OB History    Gravida  3   Para  1   Term  1   Preterm      AB  1   Living  1     SAB  1   TAB      Ectopic      Multiple  0   Live Births  1          No past medical history on file. Past Surgical History:  Procedure Laterality Date  . dilate and evacutation     Family History: family history includes Diabetes in her maternal grandmother; Hypertension in her maternal grandmother. Social History:  reports that she has never smoked. She has never used smokeless tobacco. She reports that she does not drink alcohol or use drugs.     Maternal Diabetes: No Genetic Screening: Normal Maternal Ultrasounds/Referrals: Normal Fetal Ultrasounds or other Referrals:  None Maternal Substance Abuse:  No Significant Maternal Medications:  Meds include: Zoloft Significant Maternal Lab Results:  Group B Strep positive Other Comments:  None  ROS Maternal Medical History:  Fetal activity: Perceived fetal activity is normal.   Last perceived fetal movement was within the past hour.    Prenatal complications: no prenatal complications Prenatal Complications - Diabetes: none.      Last menstrual period 01/13/2019, unknown if currently breastfeeding. Maternal Exam:  Uterine Assessment: Contraction strength is mild.  Contraction frequency is irregular.   Abdomen: Patient reports no abdominal tenderness. Fundal height is c/w dates.   Estimated fetal weight is 8#.       Physical Exam  Constitutional: She is oriented to person, place, and time. She appears well-developed and well-nourished.  GI: Soft. There is no rebound and no guarding.  Neurological: She is alert and  oriented to person, place, and time.  Skin: Skin is warm and dry.  Psychiatric: She has a normal mood and affect. Her behavior is normal.    Prenatal labs: ABO, Rh: O/Positive/-- (05/01 0000) Antibody: Negative (05/01 0000) Rubella: Immune (05/01 0000) RPR: Nonreactive (05/01 0000)  HBsAg: Negative (05/01 0000)  HIV: Non-reactive (05/01 0000)  GBS: Positive/-- (11/11 0000)   Assessment/Plan: 23yo G3P1011 at [redacted]w[redacted]d for elective IOL -GBS pos-PCN -Pitocin -AROM after ABX in -CLEA when desired -Anticipate NSVD   Linda Hedges 10/14/2019, 9:17 AM

## 2019-10-14 NOTE — Progress Notes (Addendum)
Michelle Goodman is a 23 y.o. G3P1011 at [redacted]w[redacted]d by ultrasound admitted for induction of labor due to Elective at term.  Subjective: Feeling CTX with increasing intensity.  Active FM.  Objective: BP 111/61   Pulse 72   Temp 98.5 F (36.9 C) (Axillary)   Resp 18   Ht 5' 3.5" (1.613 m)   Wt 76.2 kg   LMP 01/13/2019 Comment: negative beta HCG 11/07/17  BMI 29.29 kg/m  No intake/output data recorded. No intake/output data recorded.  FHT:  FHR: 135 bpm, variability: moderate,  accelerations:  Present,  decelerations:  Absent UC:   irregular, every 3-5 minutes SVE:   Dilation: 4 Effacement (%): 50 Station: -2 Exam by:: Dr Lynnette Caffey AROM clear fluid  Labs: Lab Results  Component Value Date   WBC 6.3 10/14/2019   HGB 10.1 (L) 10/14/2019   HCT 32.0 (L) 10/14/2019   MCV 84.9 10/14/2019   PLT 132 (L) 10/14/2019    Assessment / Plan: Induction of labor due to maternal request,  progressing well on pitocin  Labor: Progressing normally Preeclampsia:  n/a Fetal Wellbeing:  Category I Pain Control:  Labor support without medications I/D:  n/a Anticipated MOD:  NSVD  Michelle Goodman 10/14/2019, 1:50 PM

## 2019-10-15 LAB — CBC
HCT: 29.4 % — ABNORMAL LOW (ref 36.0–46.0)
Hemoglobin: 9.2 g/dL — ABNORMAL LOW (ref 12.0–15.0)
MCH: 26.8 pg (ref 26.0–34.0)
MCHC: 31.3 g/dL (ref 30.0–36.0)
MCV: 85.7 fL (ref 80.0–100.0)
Platelets: 108 10*3/uL — ABNORMAL LOW (ref 150–400)
RBC: 3.43 MIL/uL — ABNORMAL LOW (ref 3.87–5.11)
RDW: 15.2 % (ref 11.5–15.5)
WBC: 9.1 10*3/uL (ref 4.0–10.5)
nRBC: 0 % (ref 0.0–0.2)

## 2019-10-15 LAB — RPR: RPR Ser Ql: NONREACTIVE

## 2019-10-15 MED ORDER — IBUPROFEN 600 MG PO TABS: 600.0000 mg | ORAL_TABLET | ORAL | 0 refills | Status: AC | PRN

## 2019-10-15 NOTE — Anesthesia Postprocedure Evaluation (Signed)
Anesthesia Post Note  Patient: Michelle Goodman  Procedure(s) Performed: AN AD [place]     Patient location during evaluation: Mother Baby Anesthesia Type: Epidural Level of consciousness: awake, awake and alert and oriented Pain management: pain level controlled Vital Signs Assessment: post-procedure vital signs reviewed and stable Respiratory status: spontaneous breathing and respiratory function stable Cardiovascular status: blood pressure returned to baseline Postop Assessment: no headache, epidural receding, patient able to bend at knees, adequate PO intake, no backache, no apparent nausea or vomiting and able to ambulate Anesthetic complications: no    Last Vitals:  Vitals:   10/15/19 0043 10/15/19 0535  BP: (!) 104/51 100/62  Pulse: 67 67  Resp: 16 16  Temp: 36.8 C 36.7 C  SpO2:      Last Pain:  Vitals:   10/15/19 0535  TempSrc: Oral  PainSc:    Pain Goal:                   Bufford Spikes

## 2019-10-15 NOTE — Lactation Note (Signed)
This note was copied from a baby's chart. Lactation Consultation Note  Patient Name: Michelle Goodman [name] Date: 10/15/2019 Reason for consult: Initial assessment;Term  Spoke to [employer] regarding mom offering breastmilk to baby. She came as formula only but has brought her own pump from home and said she'll be pumping and bottle feeding breastmilk. Last feeding on Flowsheets was 7 ml of breastmilk; all previous ones have been [employer]. RN Nira Conn confirmed to [facility] that mom does not want to see lactation at this point, but she's aware of [facility] and will contact PRN; she requested a 24 hours discharge, baby is 69 hours old.  Maternal Data    Feeding Feeding Type: Breast Milk Nipple Type: Slow - flow  LATCH Score                   Interventions    Lactation Tools Discussed/Used     Consult Status Consult Status: Complete Date: 10/15/19 Follow-up type: Call as needed    [place] 10/15/2019, 3:42 PM

## 2019-10-15 NOTE — Discharge Summary (Signed)
Obstetric Discharge Summary Reason for Admission: induction of labor Prenatal Procedures: none Intrapartum Procedures: spontaneous vaginal delivery Postpartum Procedures: none Complications-Operative and Postpartum: 2 degree perineal laceration Hemoglobin  Date Value Ref Range Status  10/15/2019 9.2 (L) 12.0 - 15.0 g/dL Final   HCT  Date Value Ref Range Status  10/15/2019 29.4 (L) 36.0 - 46.0 % Final    Physical Exam:  General: alert, cooperative, appears stated age and no distress Lochia: appropriate Uterine Fundus: firm Incision: healing well DVT Evaluation: No evidence of DVT seen on physical exam.  Discharge Diagnoses: Term Pregnancy-delivered  Discharge Information: Date: 10/15/2019 Activity: pelvic rest Diet: routine Medications: PNV and Ibuprofen Condition: stable Instructions: refer to practice specific booklet Discharge to: home   Newborn Data: Live born female  Birth Weight: 7 lb 3.9 oz (3285 g) APGAR: 8, 9  Newborn Delivery   Birth date/time: 10/14/2019 16:44:00 Delivery type: Vaginal, Spontaneous      Home with mother.  Luz Lex 10/15/2019, 9:00 AM

## 2019-10-16 MED ORDER — OXYCODONE HCL 5 MG PO TABS: 5.0000 mg | ORAL_TABLET | Freq: Four times a day (QID) | ORAL | 0 refills | Status: AC | PRN

## 2019-10-16 MED ORDER — ACETAMINOPHEN 325 MG PO TABS: 650.0000 mg | ORAL_TABLET | Freq: Four times a day (QID) | ORAL | 0 refills | Status: AC | PRN

## 2019-10-16 NOTE — Progress Notes (Signed)
Post Partum Day 2 Subjective: no complaints, up ad lib, voiding, tolerating PO and + flatus  Objective: Blood pressure (!) 100/58, pulse 70, temperature 97.9 F (36.6 C), temperature source Oral, resp. rate 16, height 5' 3.5" (1.613 m), weight 76.2 kg, last menstrual period 01/13/2019, SpO2 100 %, unknown if currently breastfeeding.  Physical Exam:  General: alert, cooperative and no distress Lochia: appropriate Uterine Fundus: firm Incision: healing well DVT Evaluation: No evidence of DVT seen on physical exam.  Recent Labs    10/14/19 1729 10/15/19 0504  HGB 10.1* 9.2*  HCT 32.5* 29.4*    Assessment/Plan: Discharge home   LOS: 2 days   Shon Millet II 10/16/2019, 8:10 AM

## 2020-04-15 IMAGING — CT CT MAXILLOFACIAL W/ CM
3 series · 16 of 47 positions shown, 19 images · IV contrast (omnipaque)
Comparison: None.

CLINICAL DATA: Facial swelling for 1 day

EXAM:
CT MAXILLOFACIAL WITH CONTRAST
TECHNIQUE: Multidetector CT imaging of the maxillofacial structures was
performed with intravenous contrast. Multiplanar CT image
reconstructions were also generated.
CONTRAST:  75mL OMNIPAQUE IOHEXOL 300 MG/ML  SOLN

[Series 3: max soft · axial · 0.34mm/px · z∈[-155,-23]mm · 10 of 78 slices shown, 13 images]
[im 6/78  brain]
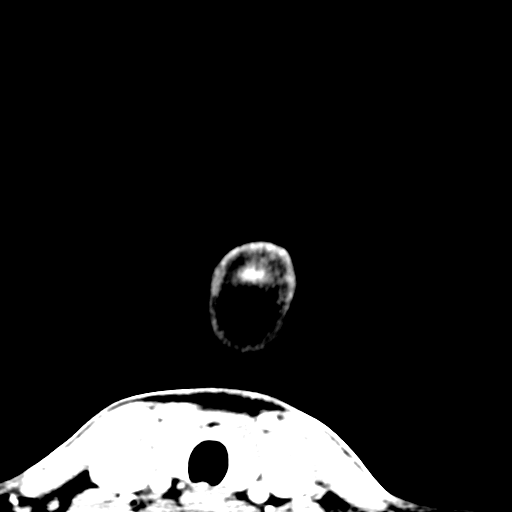
[im 6/78  bone]
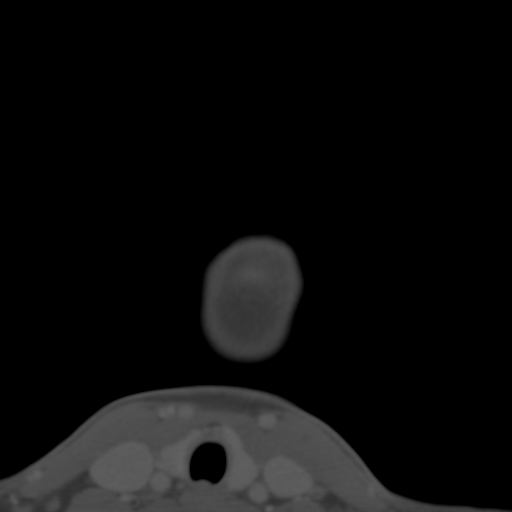
[im 14/78  bone]
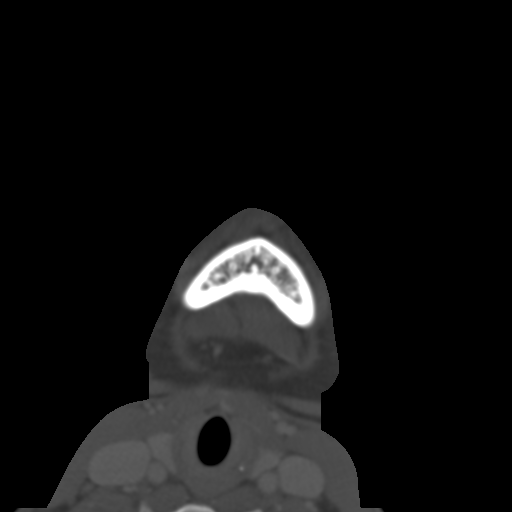
[im 22/78  bone]
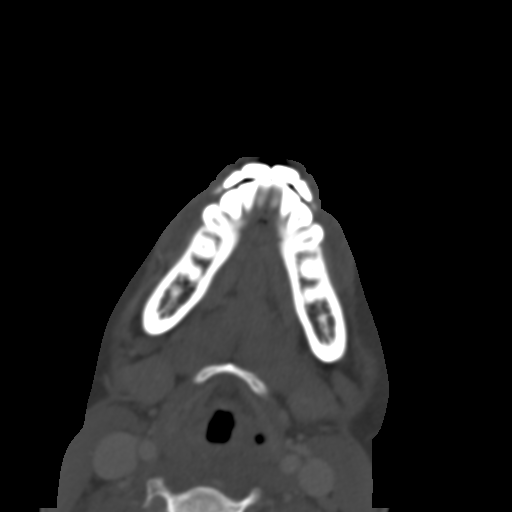
[im 27/78  bone]
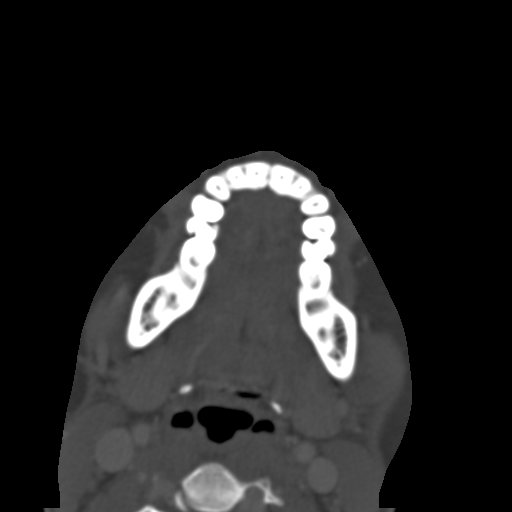
[im 35/78  brain]
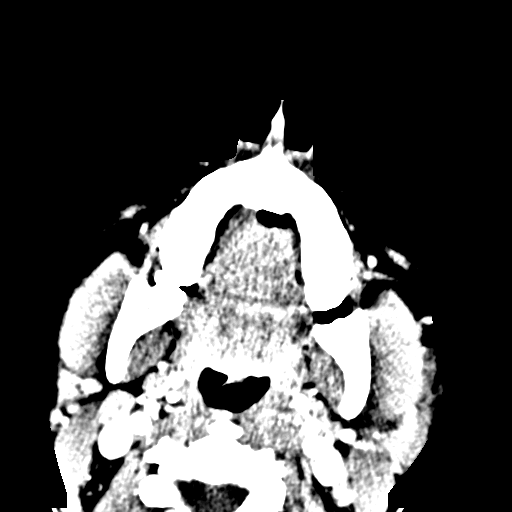
[im 35/78  bone]
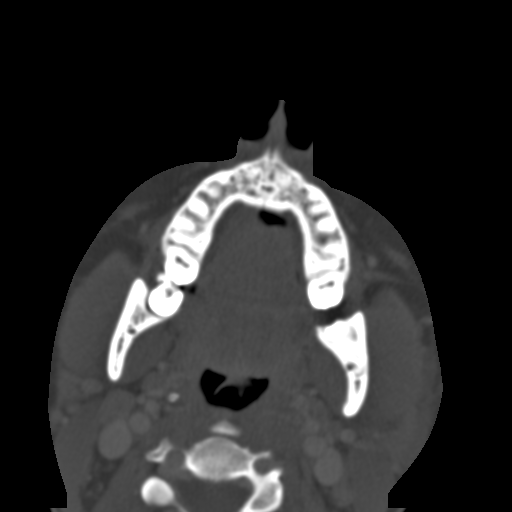
[im 43/78  bone]
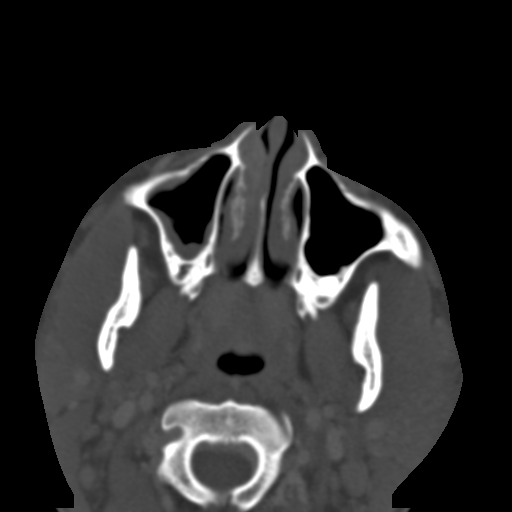
[im 51/78  bone]
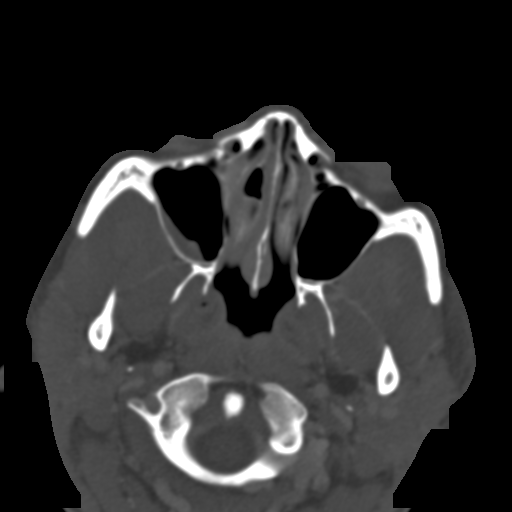
[im 59/78  bone]
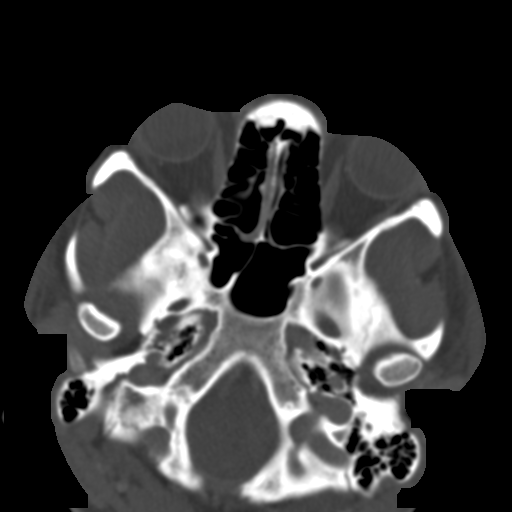
[im 64/78  brain]
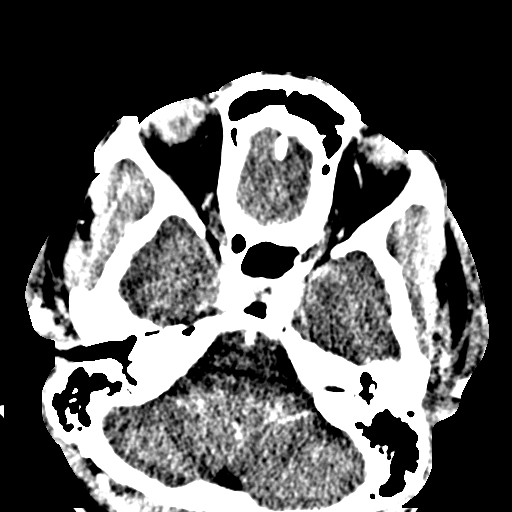
[im 64/78  bone]
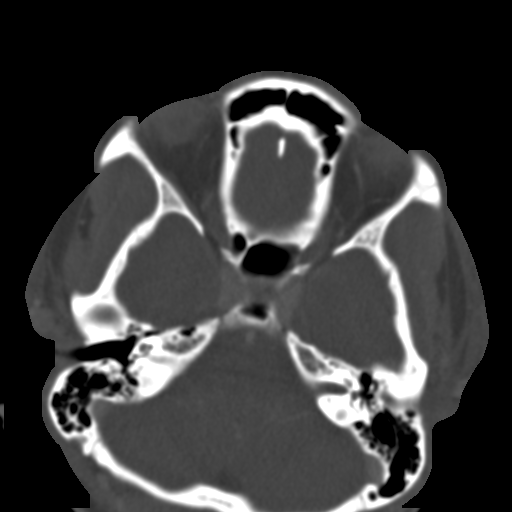
[im 72/78  bone]
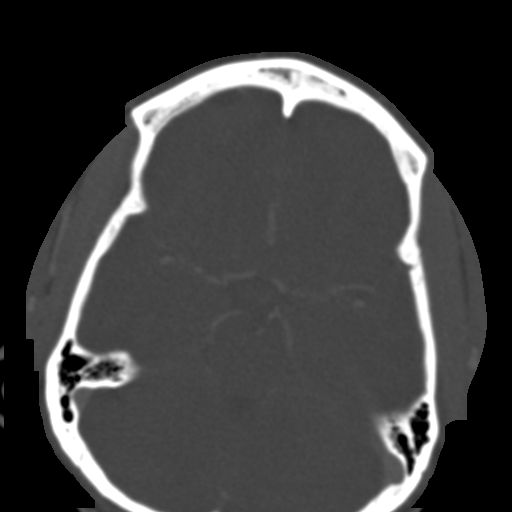

[Series 5: coronal soft · coronal · 0.33mm/px · 3 of 72 slices shown]
[im 24/72  bone]
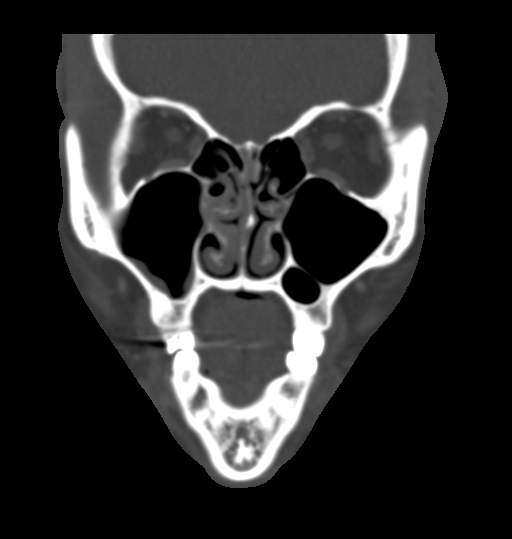
[im 32/72  bone]
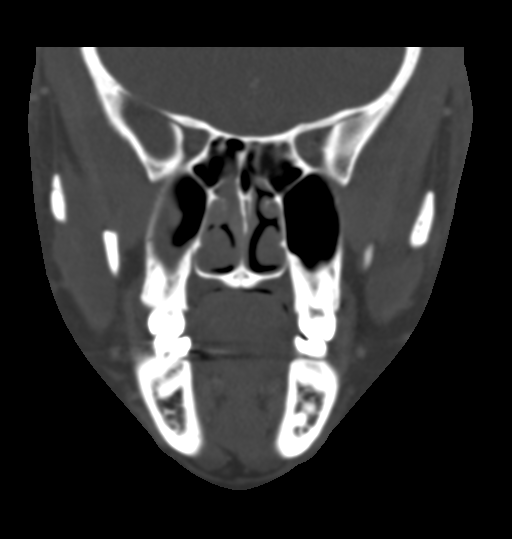
[im 40/72  bone]
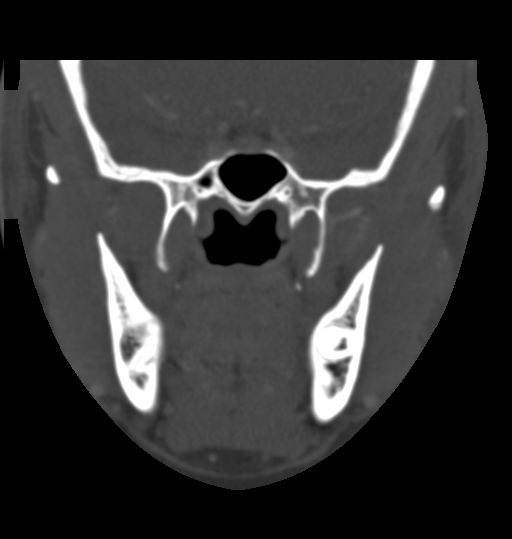

[Series 6: sagittal soft · sagittal · 0.31mm/px · 3 of 83 slices shown]
[im 28/83  bone]
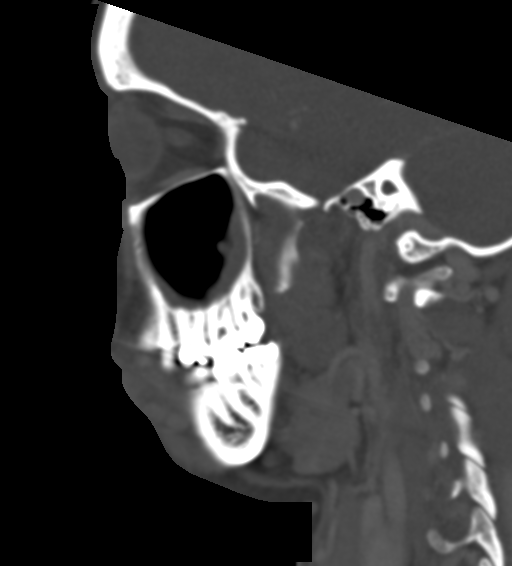
[im 42/83  bone]
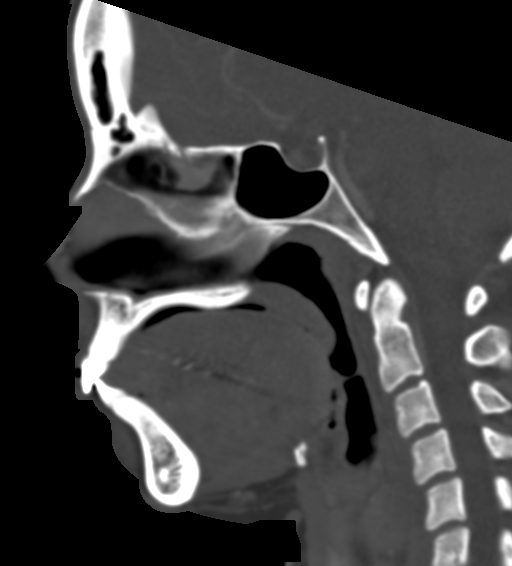
[im 55/83  bone]
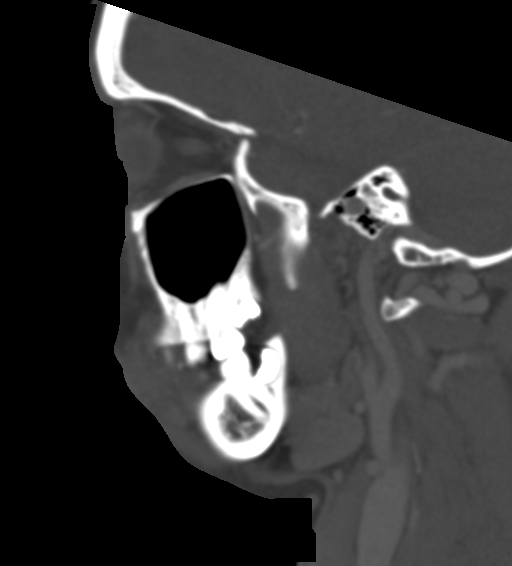

[16 of 47 positions shown; findings below may reference images not displayed]

FINDINGS: Osseous: No facial fracture.

Dental: Unremarkable appearance of the teeth, mandible and hard
palate.

Orbits: The globes are intact. Normal appearance of the intra- and
extraconal fat. Symmetric extraocular muscles.

Sinuses: No fluid levels or advanced mucosal thickening.

Soft tissues: There is superficial inflammatory induration within
subcutaneous fat overlying both parotid glands. There are numerous
bilateral level 2A cervical lymph nodes, measuring up to 7 mm.
Inflammatory changes are worse on the left. There is no
sialolithiasis. No abnormality along the course of the parotid
ducts.

Limited intracranial: Normal.
IMPRESSION: 1. Inflammatory change superficial to both parotid glands,
left-greater-than-right, consistent with acute sialadenitis. This
includes the possibility of mumps in the appropriate population.
2. Reactive bilateral upper cervical lymphadenopathy.

## 2022-02-25 ENCOUNTER — Ambulatory Visit (HOSPITAL_COMMUNITY)
Admission: EM | Admit: 2022-02-25 | Discharge: 2022-02-25 | Disposition: A | Discharge status: Home / Self Care | Payer: 59 | Attending: Internal Medicine | Admitting: Internal Medicine

## 2022-02-25 ENCOUNTER — Encounter (HOSPITAL_COMMUNITY): Payer: Self-pay

## 2022-02-25 DIAGNOSIS — Z202 Contact with and (suspected) exposure to infections with a predominantly sexual mode of transmission: Secondary | ICD-10-CM | POA: Insufficient documentation

## 2022-02-25 DIAGNOSIS — Z113 Encounter for screening for infections with a predominantly sexual mode of transmission: Secondary | ICD-10-CM | POA: Insufficient documentation

## 2022-02-25 NOTE — Discharge Instructions (Signed)
Your STD tests are pending.  We will call if anything is positive and treat as appropriate.  Please refrain from sexual activity until test results and treatment are complete. ?

## 2022-02-25 NOTE — ED Provider Notes (Signed)
?[facility] ? ? ? ?CSN: [id number] ?Arrival date & time: 02/25/22  1553 ? ? ?  ? ?History   ?Chief Complaint ?Chief Complaint  ?Patient presents with  ? Exposure to STD  ? ? ?HPI ?Michelle Goodman is a 26 y.o. female.  ? ?Patient presents today for STD testing.  She reports that she received a phone call today from one of her recent sexual partners that they were positive for an STD.  She states that she is not sure what STD they have tested positive for, and her sexual partner did not divulge this in the information.  She had a sexual encounter with this person on February 09, 2022.  Denies any associated symptoms including dysuria, urinary frequency, vaginal discharge, abdominal pain, pelvic pain, fever, back pain.  Patient also requesting testing for HIV and syphilis. ? ? ?Exposure to STD ? ? ?History reviewed. No pertinent past medical history. ? ?Patient Active Problem List  ? Diagnosis Date Noted  ? Pregnancy 10/14/2019  ? Indication for care in labor or delivery 10/18/2016  ? ? ?Past Surgical History:  ?Procedure Laterality Date  ? dilate and evacutation    ? ? ?OB History   ? ? Gravida  ?3  ? Para  ?2  ? Term  ?2  ? Preterm  ?   ? AB  ?1  ? Living  ?2  ?  ? ? SAB  ?1  ? IAB  ?   ? Ectopic  ?   ? Multiple  ?0  ? Live Births  ?2  ?   ?  ?  ? ? ? ?Home Medications   ? ?Prior to Admission medications   ?Medication Sig Start Date End Date Taking? Authorizing Provider  ?acetaminophen (TYLENOL) 325 MG tablet Take 2 tablets (650 mg total) by mouth every 6 (six) hours as needed. 10/16/19   Everlene Farrier, MD  ?ibuprofen (ADVIL) 600 MG tablet Take 1 tablet (600 mg total) by mouth every 4 (four) hours as needed for mild pain or moderate pain. 10/15/19   Louretta Shorten, MD  ?oxyCODONE (ROXICODONE) 5 MG immediate release tablet Take 1 tablet (5 mg total) by mouth every 6 (six) hours as needed for severe pain. 10/16/19   Everlene Farrier, MD  ? ? ?Family History ?Family History  ?Problem Relation Age of Onset  ?  Hypertension Maternal Grandmother   ? Diabetes Maternal Grandmother   ? ? ?Social History ?Social History  ? ?Tobacco Use  ? Smoking status: Never  ? Smokeless tobacco: Never  ?Vaping Use  ? Vaping Use: Never used  ?Substance Use Topics  ? Alcohol use: No  ? Drug use: No  ? ? ? ?Allergies   ?Patient has no known allergies. ? ? ?Review of Systems ?Review of Systems ?Per HPI ? ?Physical Exam ?Triage Vital Signs ?ED Triage Vitals [02/25/22 1658]  ?Enc Vitals Group  ?   BP 130/88  ?   Pulse Rate 78  ?   Resp 18  ?   Temp 98.1 ?F (36.7 ?C)  ?   Temp Source Oral  ?   SpO2 100 %  ?   Weight   ?   Height   ?   Head Circumference   ?   Peak Flow   ?   Pain Score   ?   Pain Loc   ?   Pain Edu?   ?   Excl. in [place]?   ? ?No data found. ? ?Updated  Vital Signs ?BP 130/88 (BP Location: Left Arm)   Pulse 78   Temp 98.1 ?F (36.7 ?C) (Oral)   Resp 18   SpO2 100%  ? ?Visual Acuity ?Right Eye Distance:   ?Left Eye Distance:   ?Bilateral Distance:   ? ?Right Eye Near:   ?Left Eye Near:    ?Bilateral Near:    ? ?Physical Exam ?Constitutional:   ?   General: She is not in acute distress. ?   Appearance: Normal appearance. She is not toxic-appearing or diaphoretic.  ?HENT:  ?   Head: Normocephalic and atraumatic.  ?Eyes:  ?   Extraocular Movements: Extraocular movements intact.  ?   Conjunctiva/sclera: Conjunctivae normal.  ?Pulmonary:  ?   Effort: Pulmonary effort is normal.  ?Genitourinary: ?   Comments: Deferred with shared decision making.  Self swab performed. ?Neurological:  ?   General: No focal deficit present.  ?   Mental Status: She is alert and oriented to person, place, and time. Mental status is at baseline.  ?Psychiatric:     ?   Mood and Affect: Mood normal.     ?   Behavior: Behavior normal.     ?   Thought Content: Thought content normal.     ?   Judgment: Judgment normal.  ? ? ? ?UC Treatments / Results  ?Labs ?(all labs ordered are listed, but only abnormal results are displayed) ?Labs Reviewed  ?RPR  ?HIV ANTIBODY  (ROUTINE TESTING W REFLEX)  ?CERVICOVAGINAL ANCILLARY ONLY  ? ? ?EKG ? ? ?Radiology ?No results found. ? ?Procedures ?Procedures (including critical care time) ? ?Medications Ordered in UC ?Medications - No data to display ? ?Initial Impression / Assessment and Plan / UC Course  ?I have reviewed the triage vital signs and the nursing notes. ? ?Pertinent labs & imaging results that were available during my care of the patient were reviewed by me and considered in my medical decision making (see chart for details). ? ?  ? ?Cervicovaginal swab pending.  HIV and RPR pending.  Patient was instructed that we can empirically treat given possible exposure for gonorrhea, chlamydia, trichomonas.  Although, patient states that she does not want to be treated until test results are complete.  Risks associated with doing this were discussed with patient.  Patient voiced understanding.  Therefore, will await results before any further treatment at this time.  Advised patient to refrain from sexual activity until test results and treatment are complete.  Discussed return precautions.  Patient verbalized understanding and was agreeable with plan. ?Final Clinical Impressions(s) / UC Diagnoses  ? ?Final diagnoses:  ?Possible exposure to STD  ?Screening examination for venereal disease  ? ? ? ?Discharge Instructions   ? ?  ?Your STD tests are pending.  We will call if anything is positive and treat as appropriate.  Please refrain from sexual activity until test results and treatment are complete. ? ? ? ?ED Prescriptions   ?None ?  ? ?PDMP not reviewed this encounter. ?  ?Teodora Medici, [place] ?02/25/22 1713 ? ?

## 2022-02-25 NOTE — ED Triage Notes (Signed)
Pt received a phone that she has been expose to STI. She would like to be seen.  ?

## 2022-02-26 LAB — CERVICOVAGINAL ANCILLARY ONLY
Bacterial Vaginitis (gardnerella): POSITIVE — AB
Candida Glabrata: NEGATIVE
Candida Vaginitis: NEGATIVE
Chlamydia: POSITIVE — AB
Comment: NEGATIVE
Comment: NEGATIVE
Comment: NEGATIVE
Comment: NEGATIVE
Comment: NEGATIVE
Comment: NORMAL
Neisseria Gonorrhea: NEGATIVE
Trichomonas: NEGATIVE

## 2022-02-26 LAB — RPR: RPR Ser Ql: NONREACTIVE

## 2022-02-27 ENCOUNTER — Telehealth (HOSPITAL_COMMUNITY): Payer: Self-pay | Admitting: Emergency Medicine

## 2022-02-27 LAB — HIV ANTIBODY (ROUTINE TESTING W REFLEX): HIV Screen 4th Generation wRfx: NONREACTIVE

## 2022-02-27 MED ORDER — METRONIDAZOLE 0.75 % VA GEL: 1.0000 | Freq: Every day | VAGINAL | 0 refills | Status: AC

## 2022-02-27 MED ORDER — DOXYCYCLINE HYCLATE 100 MG PO CAPS: 100.0000 mg | ORAL_CAPSULE | Freq: Two times a day (BID) | ORAL | 0 refills | Status: AC
